# Patient Record
Sex: Female | Born: 1955
Health system: Southern US, Community
[De-identification: ages and names within clinical notes are randomized; demographics above are authoritative.]

## PROBLEM LIST (undated history)

## (undated) DIAGNOSIS — I10 Essential (primary) hypertension: Secondary | ICD-10-CM

## (undated) DIAGNOSIS — E079 Disorder of thyroid, unspecified: Secondary | ICD-10-CM

## (undated) HISTORY — PX: ORTHOPEDIC SURGERY: SHX850

## (undated) HISTORY — PX: OTHER SURGICAL HISTORY: SHX169

## (undated) HISTORY — DX: Essential (primary) hypertension: I10

---

## 2008-02-23 HISTORY — PX: EYE SURGERY: SHX253

## 2010-09-09 ENCOUNTER — Ambulatory Visit (HOSPITAL_COMMUNITY): Admission: RE | Admit: 2010-09-09 | Discharge: 2010-09-09 | Payer: Self-pay | Admitting: Pulmonary Disease

## 2010-09-23 ENCOUNTER — Ambulatory Visit: Payer: Self-pay | Admitting: Orthopedic Surgery

## 2010-09-23 DIAGNOSIS — M1711 Unilateral primary osteoarthritis, right knee: Secondary | ICD-10-CM | POA: Insufficient documentation

## 2010-09-23 DIAGNOSIS — M23302 Other meniscus derangements, unspecified lateral meniscus, unspecified knee: Secondary | ICD-10-CM | POA: Insufficient documentation

## 2010-09-26 ENCOUNTER — Encounter (INDEPENDENT_AMBULATORY_CARE_PROVIDER_SITE_OTHER): Payer: Self-pay | Admitting: *Deleted

## 2010-09-27 ENCOUNTER — Encounter: Payer: Self-pay | Admitting: Orthopedic Surgery

## 2010-09-27 ENCOUNTER — Ambulatory Visit (HOSPITAL_COMMUNITY): Admission: RE | Admit: 2010-09-27 | Discharge: 2010-09-27 | Payer: Self-pay | Admitting: Orthopedic Surgery

## 2010-10-03 ENCOUNTER — Ambulatory Visit: Payer: Self-pay | Admitting: Orthopedic Surgery

## 2010-12-24 NOTE — Letter (Signed)
Summary: history form  history form   Imported By: Jacklynn Ganong 09/30/2010 11:21:45  _____________________________________________________________________  External Attachment:    Type:   Image     Comment:   External Document

## 2010-12-24 NOTE — Assessment & Plan Note (Signed)
Summary: mri results right knee to bring disc.cbt   Visit Type:  Follow-up Referring Provider:  Dr. Juanetta Gosling Primary Provider:  Dr. Juanetta Gosling  CC:  mri results right knee.  History of Present Illness:    Meds: Lisinopril 20mg , HCTZ 12.5, Advil as needed  55 year old female had knee arthroscopy of the RIGHT knee and [place]. Approximately 8 years ago, at which time she was told she had torn meniscus, as well as some mild degeneration of the cartilage presents back with catching and locking and medial knee pain with swelling and loss of motion of the RIGHT knee, which has been going on for approximately a year. She's been treated with anti-inflammatories with minimal improvement  Today is MRI results right knee taken [facility] 09/27/10.  IMPRESSION:   1.  Dominant finding is degenerative change about the knee which appears worst in the medial compartment. 2.  Status post subtotal medial meniscectomy.  A small oblique tear at the junction of the posterior horn and body of the medial meniscus is noted.  Allergies: No Known Drug Allergies  Past History:  Past medical, surgical, family and social histories (including risk factors) reviewed, and no changes noted (except as noted below).  Past Medical History: Reviewed history from 09/23/2010 and no changes required. htn  Past Surgical History: Reviewed history from 09/23/2010 and no changes required. right leg removal of vericose veins  Family History: Reviewed history from 09/23/2010 and no changes required. na  Social History: Reviewed history from 09/23/2010 and no changes required. 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  Gait:    Normal heel-toe gait pattern bilaterally.    Skin:    Intact, no scars, lesions, rashes, cafe au lait spots, or bruising.      Inspection:     No deformity, ecchymosis or swelling.   Palpation:    tenderness R-medial joint line.    Vascular:    There was no swelling or varicose veins. The pulses and temperature are normal. There was no edema or tenderness.  Sensory:    Gross coordination and sensation were normal.    Motor:    Motor strength 5/5 bilaterally for quadriceps, hamstrings, ankle dorsiflexion, and ankle plantar flexion.    Knee Exam:    Right:    Range of Motion:       Flexion-Active: 125 degrees       Extension-Active: full   Impression & Recommendations:  Problem # 1:  ARTHRITIS, RIGHT KNEE (ICD-716.96) Assessment Comment Only  we discussed the findings of this MRI after I reviewed. I reviewed the report. I think that she has arthritis of the knee. There may be a little meniscal tear, but is most likely postop surgical change.  We talked about knee replacement versus nonoperative therapy.  We both decided that a cortisone injection, knee, exercise program weight loss, and exercise, and anti-inflammatory medication would be the better [facility]  Injected RIGHT knee Verbal consent was obtained. The RIGHT knee was prepped with alcohol and ethyl chloride. 1 cc of depomedrol 40mg /cc and 4 cc of lidocaine 1% was injected. there were no complications.  Orders: Est. Patient Level III (16109) Joint Aspirate / Injection, Large (20610) Depo- Medrol 40mg  (J1030)  Problem # 2:  DERANGEMENT MENISCUS (ICD-717.5) Assessment: Comment Only  Orders: Est. Patient Level III (60454)  Medications Added to Medication List  This Visit: 1)  Nabumetone 500 Mg Tabs (Nabumetone) .Marland Kitchen.. 1 by mouth two times a day  Patient Instructions: 1)  You have received an injection of cortisone today. You may experience increased pain at the injection site. Apply ice pack to the area for 20 minutes every 2 hours and take 2 xtra strength tylenol every 8 hours. This increased pain will usually resolve in 24 hours. The  injection will take effect in 3-10 days.  2)  start arthritis medication two times a day 3)  start exercises for the knee for next 6 weeks  4)  return in 3 months  Prescriptions: NABUMETONE 500 MG TABS (NABUMETONE) 1 by mouth two times a day  #60 x 2   Entered and Authorized by:   Fuller Canada MD   Signed by:   Fuller Canada MD on 10/03/2010   Method used:   Print then Give to Patient   RxID:   [id number]    Orders Added: 1)  Est. Patient Level III [13086] 2)  Joint Aspirate / Injection, Large [20610] 3)  Depo- Medrol 40mg  [J1030]

## 2010-12-24 NOTE — Assessment & Plan Note (Signed)
Summary: CONSULT/TREAT/RT KNEE PAIN/NEEDS XRAY/REF [place]/[employer]BS/CAF   Vital Signs:  Patient profile:   55 year old female Height:      65 inches Weight:      190 pounds Pulse rate:   76 / minute Resp:     16 per minute  Vitals Entered By: Fuller Canada MD (September 23, 2010 3:25 PM)  Visit Type:  Initial Consult Referring Provider:  Dr. Juanetta Gosling Primary Provider:  Dr. Juanetta Gosling  CC:  right knee pain.  History of Present Illness: I saw Autumn Pittman in the office today for an initial visit.  She is a 55 years old woman with the complaint of:  right knee pain.  No injury.  Xrays today.  Meds: Lisinopril 20mg , HCTZ 25.86.  55 year old female had knee arthroscopy of the RIGHT knee and [place]. Approximately 8 years ago, at which time she was told she had torn meniscus, as well as some mild degeneration of the cartilage presents back with catching and locking and medial knee pain with swelling and loss of motion of the RIGHT knee, which has been going on for approximately a year. 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 Cardiovascular:  Denies chest pain, palpitations, fainting, and murmurs. Respiratory:  Denies short of breath, wheezing, couch, tightness, pain on inspiration, and snoring . Gastrointestinal:  Denies heartburn, nausea, vomiting, diarrhea, constipation, and blood in your stools. Genitourinary:  Denies frequency, urgency, difficulty urinating, painful urination, flank pain, and bleeding in urine. Neurologic:  Denies numbness, tingling, unsteady gait,  dizziness, tremors, and seizure. Musculoskeletal:  Complains of joint pain and swelling; denies instability, stiffness, redness, heat, and muscle pain. Endocrine:  Denies excessive thirst, exessive urination, and heat or cold intolerance. Psychiatric:  Denies nervousness, depression, anxiety, and hallucinations. Skin:  Denies changes in the skin, poor healing, rash, itching, and redness. HEENT:  Denies blurred or double vision, eye pain, redness, and watering. Immunology:  Denies seasonal allergies, sinus problems, and allergic to bee stings. Hemoatologic:  Denies easy bleeding and brusing.  Physical Exam  Additional Exam:  GEN: well developed, well nourished, normal grooming and hygiene, no deformity and normal body habitus.   CDV: pulses are normal, no edema, no erythema. no tenderness  Lymph: normal lymph nodes   Skin: no rashes, skin lesions or open sores   NEURO: normal coordination, reflexes, sensation.   Psyche: awake, alert and oriented. Mood normal   Gait: is normal  evaluating the RIGHT knee. She does have full range of motion. She has some pain with terminal flexion area, just tenderness over the anterior knee at the patellar tendon, as well as over the medial joint line. The pes tendons seem to be non-tender. McMurray sign is abnormal. all ligaments seem to be stable.   LEFT knee seems to have full range of motion, good strength. No tenderness no swelling in normal alignment. Ligaments are stable    Impression & Recommendations:  Problem # 1:  DERANGEMENT MENISCUS (ICD-717.5) Assessment New  Unable to obtain an x-ray of the RIGHT knee and it shows that she has joint space narrowing which is severe on the medial  side along with some mild to moderate patellofemoral arthritis and mild to moderate varus deformity  However I think it is prudent to go ahead and get the MRI of her knee to ensure [place] that this is not a repair of her meniscus because she is complaining of  locking and catching  Orders: New Patient Level III (60454) Knee x-ray,  3 views (09811)  Problem # 2:  ARTHRITIS, RIGHT KNEE (ICD-716.96) Assessment: New  Orders: New Patient Level III (91478) Knee x-ray,  3 views (29562)  Patient Instructions: 1)  MRI RIGHT knee, return for result   Orders Added: 1)  New Patient Level III [13086] 2)  Knee x-ray,  3 views [73562]

## 2010-12-24 NOTE — Miscellaneous (Signed)
Summary: mri Friday reg 930am [facility] 09/27/10 right knee  Clinical Lists Changes   no precert needed for [employer], fu in our office on 10/03/10 for results bringing disc, mri right knee

## 2011-01-02 ENCOUNTER — Encounter: Payer: Self-pay | Admitting: Orthopedic Surgery

## 2011-01-02 ENCOUNTER — Ambulatory Visit (INDEPENDENT_AMBULATORY_CARE_PROVIDER_SITE_OTHER): Payer: BC Managed Care – PPO | Admitting: Orthopedic Surgery

## 2011-01-02 DIAGNOSIS — M1710 Unilateral primary osteoarthritis, unspecified knee: Secondary | ICD-10-CM

## 2011-01-02 DIAGNOSIS — M23302 Other meniscus derangements, unspecified lateral meniscus, unspecified knee: Secondary | ICD-10-CM

## 2011-01-09 NOTE — Assessment & Plan Note (Signed)
Summary: 3 M RE-CK RT KNEE/BCBS/KJ   Visit Type:  Follow-up Referring Provider:  Dr. Juanetta Gosling Primary Provider:  Dr. Juanetta Gosling  CC:  recheck right knee.  History of Present Illness: I saw Autumn Pittman in the office today for a followup visit.  She is a 55 years old woman with the complaint of:  recheck right knee.  Today is 3 month recheck right knee after injection, HEP and starting Relafen 500mg  two times a day for knee OA.  Meds: Lisinopril 20mg , HCTZ 12.5, Advil as needed.    She says her knee did feel better when she was on Relafen but she got some diarrhea and she had to stop it. After that the shot seemed to wear off, but she got good relief initially.  She is now up to 3 sets of 10 on her exercises, which are strengthened her knee and she is able to walk 3 [place].  Her medial pain seems to be coming back so she came back for reevaluation.     Allergies (verified): No Known Drug Allergies  Past History:  Past Surgical History: Last updated: 09/23/2010 right leg removal of vericose veins  Physical Exam  Additional Exam:  RIGHT knee exam she has seemed to keep her range of motion. She still has quite a bit of medial joint line symptoms and tenderness. Her knee remained stable with excellent strength. His skin is normal. No sensory deficits and the pulse and temperature of the RIGHT leg are normal.  no gait abnormality   Impression & Recommendations:  Problem # 1:  ARTHRITIS, RIGHT KNEE (ICD-716.96) Assessment Deteriorated  Orders: Est. Patient Level III (16109) Joint Aspirate / Injection, Large (20610) Depo- Medrol 40mg  (J1030)  Problem # 2:  DERANGEMENT MENISCUS (ICD-717.5) Assessment: Deteriorated  Verbal consent was obtained. The knee was prepped with alcohol and ethyl chloride. 1 cc of depomedrol 40mg /cc and 4 cc of lidocaine 1% was injected. there were no complications. [right knee]  Orders: Est. Patient Level III (60454) Joint Aspirate / Injection,  Large (20610) Depo- Medrol 40mg  (J1030)  Patient Instructions: 1)  start tylenol 1000 mg three times a day  2)  continue knee exercise program  3)  You have received an injection of cortisone today. You may experience increased pain at the injection site. Apply ice pack to the area for 20 minutes every 2 hours and take 2 xtra strength tylenol every 8 hours. This increased pain will usually resolve in 24 hours. The injection will take effect in 3-10 days.  4)  f/u in 3 mos to repeat xrays of right knee    Orders Added: 1)  Est. Patient Level III [09811] 2)  Joint Aspirate / Injection, Large [20610] 3)  Depo- Medrol 40mg  [J1030]

## 2011-04-02 ENCOUNTER — Ambulatory Visit: Payer: BC Managed Care – PPO | Admitting: Orthopedic Surgery

## 2011-04-22 ENCOUNTER — Ambulatory Visit (INDEPENDENT_AMBULATORY_CARE_PROVIDER_SITE_OTHER): Payer: BC Managed Care – PPO | Admitting: Orthopedic Surgery

## 2011-04-22 DIAGNOSIS — M179 Osteoarthritis of knee, unspecified: Secondary | ICD-10-CM | POA: Insufficient documentation

## 2011-04-22 DIAGNOSIS — IMO0002 Reserved for concepts with insufficient information to code with codable children: Secondary | ICD-10-CM

## 2011-04-22 DIAGNOSIS — M1710 Unilateral primary osteoarthritis, unspecified knee: Secondary | ICD-10-CM

## 2011-04-22 MED ORDER — TRAMADOL-ACETAMINOPHEN 37.5-325 MG PO TABS: 1.0000 | ORAL_TABLET | ORAL | Status: AC | PRN

## 2011-04-22 MED ORDER — TRAMADOL-ACETAMINOPHEN 37.5-325 MG PO TABS: 1.0000 | ORAL_TABLET | ORAL | Status: DC | PRN

## 2011-04-22 NOTE — Progress Notes (Signed)
Separate x-ray report 3 views, RIGHT knee.  The medial joint space is severely narrowed. There is mild varus deformity. This is exacerbated on the lateral x-ray.  Impression medial compartment gonarthrosis

## 2011-04-22 NOTE — Progress Notes (Signed)
55 years old woman with the complaint of: recheck right knee.  Today is 3 month recheck right knee after injection, HEP and starting Relafen 500mg  two times a day for knee OA.  Meds: Lisinopril 20mg , HCTZ 12.5, Advil as needed.  She says her knee did feel better when she was on Relafen but she got some diarrhea and she had to stop it. After that the shot seemed to wear off, but she got good relief initially.     Review of systems musculoskeletal negative.  Exam continued tenderness and pain over the medial compartment. Flexion remains normal stability remains normal. Muscle tone is normal neurovascular damage, normal ambulation, normal.  Repeat x-rays show continued medial compartment joint space narrowing, consistent with medial osteoarthrosis.  Current Medication Advil 400 mg every 4 hours, but only as needed, and not every day, but this is a total of 2400 mg, so she is pretty much maxed out on the Advil.  Recommend Ultracet.

## 2013-01-04 ENCOUNTER — Encounter: Payer: Self-pay | Admitting: Family Medicine

## 2013-01-04 ENCOUNTER — Ambulatory Visit (INDEPENDENT_AMBULATORY_CARE_PROVIDER_SITE_OTHER): Payer: BC Managed Care – PPO | Admitting: Family Medicine

## 2013-01-04 VITALS — BP 146/90 | HR 88 | Ht 66.0 in | Wt 182.0 lb

## 2013-01-04 DIAGNOSIS — M25561 Pain in right knee: Secondary | ICD-10-CM

## 2013-01-04 DIAGNOSIS — M25562 Pain in left knee: Secondary | ICD-10-CM

## 2013-01-04 DIAGNOSIS — M222X1 Patellofemoral disorders, right knee: Secondary | ICD-10-CM

## 2013-01-04 DIAGNOSIS — M1710 Unilateral primary osteoarthritis, unspecified knee: Secondary | ICD-10-CM

## 2013-01-04 DIAGNOSIS — M25569 Pain in unspecified knee: Secondary | ICD-10-CM

## 2013-01-04 DIAGNOSIS — M2240 Chondromalacia patellae, unspecified knee: Secondary | ICD-10-CM

## 2013-01-04 MED ORDER — MELOXICAM 15 MG PO TABS: 15.0000 mg | ORAL_TABLET | Freq: Every day | ORAL | Status: DC

## 2013-01-04 NOTE — Patient Instructions (Signed)
Very good to meet you Do the exercises daily PT will call you  Meloxicam daily for 10 days, at your pharmacy.  Come back in 4 weeks.

## 2013-01-04 NOTE — Assessment & Plan Note (Signed)
Patient does have bilateral chondromalacia. Patient is having more symptoms left greater than right. This is skeletal ultrasound did not show any signs of meniscal injury but did have degenerative tear of the lateral meniscus.  After verbal and written consent patient was prepped with alcohol swabs and then had injection of 4 cc of 0.5% Marcaine and 1 cc of 80 mg Depo-Medrol into the knees bilaterally with a 25-gauge 1/2 inch needle. Patient tolerated the procedure well and had pain improvement immediately.  Patient was given exercises to do a regular basis, we'll go to formal physical therapy for further range of motion and strengthening exercises. Patient given a ten-day course meloxicam. Patient will followup again in 3-4 weeks for further evaluation to make sure she continues to improve.

## 2013-01-05 NOTE — Assessment & Plan Note (Signed)
Injected as stated under chondromalacia  Same follow up  HEP

## 2013-01-05 NOTE — Progress Notes (Signed)
Chief complaint: Left knee pain  Patient is a very pleasant 57 year old female who is coming in with worsening left knee pain. Patient has a past medical history significant for a right knee medial meniscectomy back in 2004 and arthritis of the right knee. Patient describes the pain as a sharp stabbing sensation on the anterior aspect of the knee because of the medial to lateral position. Patient states that it hurts more when she climbs stairs or go downstairs. Patient denies any clicking popping but does state she has a grinding sensation. She denies that the pain radiates. Patient has tried over-the-counter anti-inflammatories with minimal improvement. Patient does work on her feet a lot and states that this is causing some concern with her work.  Patient states also because of this pain she's been using her right knee more and started having significantly more pain. Patient though she does have arthritis and there and describes the pain as more of a dull ache.  States much different from left knee and only hurting because she is compensating for her left knee pain.   No swelling or numbness.   Past Medical History  Diagnosis Date  . HTN (hypertension)    Past Surgical History  Procedure Laterality Date  . Varicose vein removal      rt leg    History  Substance Use Topics  . Smoking status: Never Smoker   . Smokeless tobacco: Never Used  . Alcohol Use: Yes   No family history on file.  Current Outpatient Prescriptions on File Prior to Visit  Medication Sig Dispense Refill  . hydrochlorothiazide (HYDRODIURIL) 12.5 MG tablet Take 12.5 mg by mouth daily.        . Ibuprofen (ADVIL PO) Take by mouth.        Marland Kitchen LISINOPRIL PO Take 20 mg by mouth daily.        No current facility-administered medications on file prior to visit.   Physical exam Blood pressure 146/90, pulse 88, height 5\' 6"  (1.676 m), weight 182 lb (82.555 kg). General: No apparent distress alert and oriented x3 mood and  affect normal Respiratory: Patient's speak in full sentences and does not appear short of breath Skin: Warm dry intact with no signs of infection or rash Neuro: Cranial nerves II through XII are intact, neurovascularly intact in all extremities with 2+ DTRs and 2+ pulses. Right knee exam: Patient has full range of motion minimally tender over the medial joint line. Neurovascularly intact distally. Negative Murray's, or ligaments appear to be intact. Patient has mild crepitus with range of motion. Left knee exam: Patient does have trace swelling on the anterior aspect of the knee. She is tender to palpation over the medial joint line and superior to the patella. Patient has significant crepitus under the patella and does have lateral tracking she does have a positive compression test on the patella. All ligaments appear to be intact and has a negative McMurray's. Neurovascular intact distally.

## 2013-01-09 ENCOUNTER — Other Ambulatory Visit: Payer: Self-pay

## 2013-01-13 ENCOUNTER — Encounter: Payer: Self-pay | Admitting: Family Medicine

## 2013-02-08 ENCOUNTER — Ambulatory Visit (INDEPENDENT_AMBULATORY_CARE_PROVIDER_SITE_OTHER): Payer: BC Managed Care – PPO | Admitting: Family Medicine

## 2013-02-08 ENCOUNTER — Encounter: Payer: Self-pay | Admitting: Family Medicine

## 2013-02-08 VITALS — BP 111/76 | HR 86 | Ht 62.0 in | Wt 182.0 lb

## 2013-02-08 DIAGNOSIS — M2240 Chondromalacia patellae, unspecified knee: Secondary | ICD-10-CM

## 2013-02-08 DIAGNOSIS — M7050 Other bursitis of knee, unspecified knee: Secondary | ICD-10-CM | POA: Insufficient documentation

## 2013-02-08 DIAGNOSIS — IMO0002 Reserved for concepts with insufficient information to code with codable children: Secondary | ICD-10-CM

## 2013-02-08 NOTE — Assessment & Plan Note (Signed)
The patient at this time does have what appears to be a pes anserine bursitis. Patient given exercises focusing on hamstring stretching but declined any other treatment options such as topical anti-inflammatories. Patient can take meloxicam daily for 3 days when it seems to be worsening. Encouraged to do icing on a regular basis as well. We can follow up again in 6 weeks if not better.

## 2013-02-08 NOTE — Progress Notes (Signed)
Chief complaint bilateral knee pain  History present illness: The patient was seen previously for what appeared to be a chondromalacia of her knees bilaterally. Patient did have steroid injections in both knees. Patient tolerated the procedure very well. Patient states since that time it has improved significantly. Patient continues to do the home exercise program on a regular basis. Patient has not formal physical therapy at this time. Overall patient states that she is approximately 80% better.  Past medical history, social, surgical and family history all reviewed.   Physical exam Blood pressure 111/76, pulse 86, height 5\' 2"  (1.575 m), weight 182 lb (82.555 kg). General: No apparent distress alert and oriented x3 mood is normal. Right knee exam full range of motion nontender and is neurovascularly intact distally. Negative McMurray's and all ligaments intact. Positive patella grind test Left knee exam: Patient does have tenderness to palpation over the pes anserine. Otherwise unremarkable exam with full range of motion and nontender over the mediolateral joint lines. Neurovascularly intact distally.

## 2013-02-08 NOTE — Assessment & Plan Note (Signed)
Patient appears to be doing better overall.  Encouraged patient continue home exercises. Patient may start formal physical therapy if she would like to but at this point she continues to improve I think we can continue to monitor. The patient will continue to wear the compression sleeve as well. She will follow up in 6-8 weeks if doing better.

## 2013-03-29 ENCOUNTER — Ambulatory Visit (HOSPITAL_BASED_OUTPATIENT_CLINIC_OR_DEPARTMENT_OTHER)
Admission: RE | Admit: 2013-03-29 | Discharge: 2013-03-29 | Disposition: A | Discharge status: Home / Self Care | Payer: BC Managed Care – PPO | Source: Ambulatory Visit | Attending: Family Medicine | Admitting: Family Medicine

## 2013-03-29 ENCOUNTER — Ambulatory Visit (INDEPENDENT_AMBULATORY_CARE_PROVIDER_SITE_OTHER): Payer: BC Managed Care – PPO | Admitting: Family Medicine

## 2013-03-29 ENCOUNTER — Encounter: Payer: Self-pay | Admitting: Family Medicine

## 2013-03-29 VITALS — BP 155/101 | HR 88 | Ht 66.0 in | Wt 190.0 lb

## 2013-03-29 DIAGNOSIS — M25569 Pain in unspecified knee: Secondary | ICD-10-CM

## 2013-03-29 DIAGNOSIS — M25562 Pain in left knee: Secondary | ICD-10-CM

## 2013-03-29 NOTE — Patient Instructions (Addendum)
Your primary issues are quad tendinitis and patellofemoral syndrome. Start formal physical therapy and do home exercises daily. Take aleve 2 tabs twice a day with food for pain and inflammation. Ice knee 15 minutes at a time 3-4 times a day. ACE wrap or compression sleeve to help keep swelling down. Consider nitro patches if not improving from quad tendinitis over next 5-6 weeks. A repeat cortisone shot with or without aspiration is a consideration - you have underlying mild knee arthritis but may also have a degenerative meniscal tear (both are treated similarly initially though). Follow up with me or in the [place] office in about 6 weeks for reevaluation.

## 2013-03-31 ENCOUNTER — Encounter: Payer: Self-pay | Admitting: Family Medicine

## 2013-03-31 DIAGNOSIS — M25561 Pain in right knee: Secondary | ICD-10-CM | POA: Insufficient documentation

## 2013-03-31 NOTE — Assessment & Plan Note (Signed)
ultrasound confirmed that she has an effusion though her primary issues at this point is quad tendinitis with patellofemoral syndrome.  She would like to start formal physical therapy - written for this.  Aleve twice a day, icing, elevation, compression.  Consider nitro patches if not improving.  Discussed repeat intraarticular injection (with or without aspiration) - only condition this would help is underlying arthritis and possible a degenerative meniscus tear - advised to defer at this time.  F/u in 6 weeks for reevaluation.

## 2013-03-31 NOTE — Progress Notes (Signed)
  Subjective:    Patient ID: Autumn Pittman, female    DOB: 08-25-1956, 57 y.o.   MRN: [id number]  PCP: Dr. Shaune Pollack  HPI 57 yo F here for left knee pain.  Patient has been seen in [place] office - had been doing well following an intraarticular left knee cortisone injection and home exercise program for patellofemoral syndrome. States this really flared up recently when she did a lot of steps (over 20000) when at [facility]. Started with some medial swelling, pain and progressed to behind kneecap and above kneecap. Hurts if she extends her knee. Has been icing and doing her home exercises. No catching, locking.  Past Medical History  Diagnosis Date  . HTN (hypertension)     Current Outpatient Prescriptions on File Prior to Visit  Medication Sig Dispense Refill  . hydrochlorothiazide (HYDRODIURIL) 12.5 MG tablet Take 12.5 mg by mouth daily.        . Ibuprofen (ADVIL PO) Take by mouth.        Marland Kitchen LISINOPRIL PO Take 20 mg by mouth daily.       . meloxicam (MOBIC) 15 MG tablet Take 1 tablet (15 mg total) by mouth daily.  10 tablet  2   No current facility-administered medications on file prior to visit.    Past Surgical History  Procedure Laterality Date  . Varicose vein removal      rt leg     Allergies  Allergen Reactions  . Relafen (Nabumetone) Diarrhea    History   Social History  . Marital Status: Married    Spouse Name: N/A    Number of Children: N/A  . Years of Education: college    Occupational History  . Administrative work     Social History Main Topics  . Smoking status: Never Smoker   . Smokeless tobacco: Never Used  . Alcohol Use: Yes  . Drug Use: Not on file  . Sexually Active: Not on file   Other Topics Concern  . Not on file   Social History Narrative  . No narrative on file    History reviewed. No pertinent family history.  BP 155/101  Pulse 88  Ht 5\' 6"  (1.676 m)  Wt 190 lb (86.183 kg)  BMI 30.68 kg/m2  Review of  Systems See HPI above.    Objective:   Physical Exam Gen: NAD  L knee: Mild effusion.  No ecchymoses, other deformity. TTP quad tendon reproducing pain - less post patellar facets, medial joint line. FROM but slow movements. Negative ant/post drawers. Negative valgus/varus testing. Negative lachmanns. Negative mcmurrays, apleys, patellar apprehension, clarkes. NV intact distally.    Assessment & Plan:  1. Left knee pain - ultrasound confirmed that she has an effusion though her primary issues at this point is quad tendinitis with patellofemoral syndrome.  She would like to start formal physical therapy - written for this.  Aleve twice a day, icing, elevation, compression.  Consider nitro patches if not improving.  Discussed repeat intraarticular injection (with or without aspiration) - only condition this would help is underlying arthritis and possible a degenerative meniscus tear - advised to defer at this time.  F/u in 6 weeks for reevaluation.

## 2013-06-22 ENCOUNTER — Other Ambulatory Visit (HOSPITAL_COMMUNITY): Payer: Self-pay | Admitting: Pulmonary Disease

## 2013-06-22 DIAGNOSIS — R7989 Other specified abnormal findings of blood chemistry: Secondary | ICD-10-CM

## 2013-07-11 ENCOUNTER — Ambulatory Visit (HOSPITAL_COMMUNITY)
Admission: RE | Admit: 2013-07-11 | Discharge: 2013-07-11 | Disposition: A | Discharge status: Home / Self Care | Payer: BC Managed Care – PPO | Source: Ambulatory Visit | Attending: Pulmonary Disease | Admitting: Pulmonary Disease

## 2013-07-11 DIAGNOSIS — R7989 Other specified abnormal findings of blood chemistry: Secondary | ICD-10-CM | POA: Insufficient documentation

## 2013-07-11 DIAGNOSIS — K824 Cholesterolosis of gallbladder: Secondary | ICD-10-CM | POA: Insufficient documentation

## 2013-07-11 DIAGNOSIS — K7689 Other specified diseases of liver: Secondary | ICD-10-CM | POA: Insufficient documentation

## 2013-07-21 ENCOUNTER — Ambulatory Visit (INDEPENDENT_AMBULATORY_CARE_PROVIDER_SITE_OTHER): Payer: BC Managed Care – PPO | Admitting: Family Medicine

## 2013-07-21 ENCOUNTER — Encounter: Payer: Self-pay | Admitting: Family Medicine

## 2013-07-21 VITALS — BP 148/91 | HR 81 | Ht 65.0 in | Wt 179.0 lb

## 2013-07-21 DIAGNOSIS — M25561 Pain in right knee: Secondary | ICD-10-CM

## 2013-07-21 DIAGNOSIS — M25569 Pain in unspecified knee: Secondary | ICD-10-CM

## 2013-07-21 MED ORDER — METHYLPREDNISOLONE ACETATE 40 MG/ML IJ SUSP
40.0000 mg | Freq: Once | INTRAMUSCULAR | Status: AC
  Administered 2013-07-21: 40 mg via INTRA_ARTICULAR

## 2013-07-21 NOTE — Patient Instructions (Addendum)
For arthritis: Take tylenol 500mg  1-2 tabs three times a day for pain. Aleve 1-2 tabs twice a day with food OR ibuprofen 600mg  three times a day with food. Glucosamine sulfate 750mg  twice a day is a supplement that may help. Capsaicin topically up to four times a day may also help with pain. Cortisone injections are an option. If cortisone injections do not help, there are different types of shots that may help but they take longer to take effect. It's important that you continue to stay active. Continue home exercise program. Consider physical therapy to strengthen muscles around the joint that hurts to take pressure off of the joint itself. Shoe inserts with good arch support may be helpful. Heat or ice 15 minutes at a time 3-4 times a day as needed to help with pain. Water aerobics and cycling with low resistance are the best two types of exercise for arthritis. Follow up with me in 1 month for reevaluation.

## 2013-07-26 ENCOUNTER — Encounter: Payer: Self-pay | Admitting: Family Medicine

## 2013-07-26 NOTE — Assessment & Plan Note (Signed)
consistent this time with flare of known mild DJD of knees.  Discussed tylenol, nsaids, glucosamine, capsaicin.  Cortisone injections given today.  Will continue with her HEP as well.  Ice as needed.  Consider PT.  F/u in 1 month.  After informed written consent, patient was lying supine on exam table. Right knee was prepped with alcohol swab and utilizing superolateral approach with ultrasound guidance, patient's right knee was injected intraarticularly with 3:1 marcaine: depomedrol. Patient tolerated the procedure well without immediate complications.  After informed written consent, patient was lying supine on exam table. Left knee was prepped with alcohol swab and utilizing superolateral approach with ultrasound guidance, patient's left knee was injected intraarticularly with 3:1 marcaine: depomedrol. Patient tolerated the procedure well without immediate complications.

## 2013-07-26 NOTE — Progress Notes (Signed)
Patient ID: Autumn Pittman, female   DOB: 1955/12/29, 57 y.o.   MRN: [id number]  PCP: Dr. Shaune Pollack  HPI 57 yo F here for f/u bilateral knee pain.  5/6: Patient has been seen in [place] office - had been doing well following an intraarticular left knee cortisone injection and home exercise program for patellofemoral syndrome. States this really flared up recently when she did a lot of steps (over 20000) when at [facility]. Started with some medial swelling, pain and progressed to behind kneecap and above kneecap. Hurts if she extends her knee. Has been icing and doing her home exercises. No catching, locking.  8/28: Patient has continued to do HEP, taking ibuprofen. Pain is in a different location than before - not patellar tendon area.  Is mostly medial and lateral and behind kneecaps. Some crunching noises. No giving out, locking, catching.  Past Medical History  Diagnosis Date  . HTN (hypertension)     Current Outpatient Prescriptions on File Prior to Visit  Medication Sig Dispense Refill  . hydrochlorothiazide (HYDRODIURIL) 12.5 MG tablet Take 12.5 mg by mouth daily.        . Ibuprofen (ADVIL PO) Take by mouth.         No current facility-administered medications on file prior to visit.    Past Surgical History  Procedure Laterality Date  . Varicose vein removal      rt leg     Allergies  Allergen Reactions  . Relafen [Nabumetone] Diarrhea    History   Social History  . Marital Status: Married    Spouse Name: N/A    Number of Children: N/A  . Years of Education: college    Occupational History  . Administrative work     Social History Main Topics  . Smoking status: Never Smoker   . Smokeless tobacco: Never Used  . Alcohol Use: Yes  . Drug Use: Not on file  . Sexual Activity: Not on file   Other Topics Concern  . Not on file   Social History Narrative  . No narrative on file    History reviewed. No pertinent family history.  BP 148/91   Pulse 81  Ht 5\' 5"  (1.651 m)  Wt 179 lb (81.194 kg)  BMI 29.79 kg/m2  Review of Systems See HPI above.    Objective:   Physical Exam Gen: NAD  Bilateral knees: Mild effusion.  No ecchymoses, other deformity. Medial and post patellar facet TTP, minimal lateral joint line TTP.  No quad tendon TTP. FROM. Negative ant/post drawers. Negative valgus/varus testing. Negative lachmanns. Negative mcmurrays, apleys, patellar apprehension. NV intact distally.    Assessment & Plan:  1. Bilateral knee pain - consistent this time with flare of known mild DJD of knees.  Discussed tylenol, nsaids, glucosamine, capsaicin.  Cortisone injections given today.  Will continue with her HEP as well.  Ice as needed.  Consider PT.  F/u in 1 month.  After informed written consent, patient was lying supine on exam table. Right knee was prepped with alcohol swab and utilizing superolateral approach with ultrasound guidance, patient's right knee was injected intraarticularly with 3:1 marcaine: depomedrol. Patient tolerated the procedure well without immediate complications.  After informed written consent, patient was lying supine on exam table. Left knee was prepped with alcohol swab and utilizing superolateral approach with ultrasound guidance, patient's left knee was injected intraarticularly with 3:1 marcaine: depomedrol. Patient tolerated the procedure well without immediate complications.

## 2013-09-29 ENCOUNTER — Other Ambulatory Visit: Payer: Self-pay

## 2014-02-10 ENCOUNTER — Encounter: Payer: Self-pay | Admitting: Family Medicine

## 2014-02-10 ENCOUNTER — Ambulatory Visit (INDEPENDENT_AMBULATORY_CARE_PROVIDER_SITE_OTHER): Payer: BC Managed Care – PPO | Admitting: Family Medicine

## 2014-02-10 VITALS — BP 173/107 | HR 67 | Ht 66.0 in | Wt 190.0 lb

## 2014-02-10 DIAGNOSIS — M25562 Pain in left knee: Principal | ICD-10-CM

## 2014-02-10 DIAGNOSIS — M25569 Pain in unspecified knee: Secondary | ICD-10-CM

## 2014-02-10 DIAGNOSIS — M25561 Pain in right knee: Secondary | ICD-10-CM

## 2014-02-10 NOTE — Patient Instructions (Signed)
For arthritis: Take tylenol 500mg  1-2 tabs three times a day for pain. Aleve 1-2 tabs twice a day with food OR ibuprofen 600mg  three times a day with food. Glucosamine sulfate 750mg  twice a day is a supplement that may help. Capsaicin topically up to four times a day may also help with pain. Cortisone injections are an option. If cortisone injections do not help, there are different types of shots that may help but they take longer to take effect. It's important that you continue to stay active. Continue home exercise program. Shoe inserts with good arch support may be helpful. Heat or ice 15 minutes at a time 3-4 times a day as needed to help with pain. Water aerobics and cycling with low resistance are the best two types of exercise for arthritis. Follow up with me as needed.

## 2014-02-15 ENCOUNTER — Encounter: Payer: Self-pay | Admitting: Family Medicine

## 2014-02-15 NOTE — Progress Notes (Signed)
Patient ID: Autumn Pittman, female   DOB: 18-Feb-1956, 58 y.o.   MRN: [id number]  PCP: Dr. Shaune Pollack  HPI 58 yo F here for f/u bilateral knee pain.  5/6: Patient has been seen in [place] office - had been doing well following an intraarticular left knee cortisone injection and home exercise program for patellofemoral syndrome. States this really flared up recently when she did a lot of steps (over 20000) when at [facility]. Started with some medial swelling, pain and progressed to behind kneecap and above kneecap. Hurts if she extends her knee. Has been icing and doing her home exercises. No catching, locking.  8/28: Patient has continued to do HEP, taking ibuprofen. Pain is in a different location than before - not patellar tendon area.  Is mostly medial and lateral and behind kneecaps. Some crunching noises. No giving out, locking, catching.  02/10/14: Patient reports injections and physical therapy helped with both knees though has started to come back past 2-3 months. More grinding in both knees and swelling. Icing and taking ibuprofen, tylenol. No giving out, locking, catching.  Past Medical History  Diagnosis Date  . HTN (hypertension)     Current Outpatient Prescriptions on File Prior to Visit  Medication Sig Dispense Refill  . hydrochlorothiazide (HYDRODIURIL) 12.5 MG tablet Take 12.5 mg by mouth daily.        . Ibuprofen (ADVIL PO) Take by mouth.         No current facility-administered medications on file prior to visit.    Past Surgical History  Procedure Laterality Date  . Varicose vein removal      rt leg     Allergies  Allergen Reactions  . Relafen [Nabumetone] Diarrhea    History   Social History  . Marital Status: Married    Spouse Name: N/A    Number of Children: N/A  . Years of Education: college    Occupational History  . Administrative work     Social History Main Topics  . Smoking status: Never Smoker   . Smokeless tobacco:  Never Used  . Alcohol Use: Yes  . Drug Use: Not on file  . Sexual Activity: Not on file   Other Topics Concern  . Not on file   Social History Narrative  . No narrative on file    History reviewed. No pertinent family history.  BP 173/107  Pulse 67  Ht 5\' 6"  (1.676 m)  Wt 190 lb (86.183 kg)  BMI 30.68 kg/m2  Review of Systems See HPI above.    Objective:   Physical Exam Gen: NAD  Bilateral knees: Mod effusion left, minimal on right.  No ecchymoses, other deformity. Medial and post patellar facet TTP, minimal lateral joint line TTP.  No quad tendon TTP. FROM. Negative ant/post drawers. Negative valgus/varus testing. Negative lachmanns. Negative mcmurrays, apleys, patellar apprehension. NV intact distally.    Assessment & Plan:  1. Bilateral knee pain - consistent with flare of known mild DJD of knees.  Discussed tylenol, nsaids, glucosamine, capsaicin.  Cortisone injections given today.  Will continue with her HEP as well.  Ice as needed.  Completed PT. F/u prn.   After informed written consent, patient was seated on exam table. Right knee was prepped with alcohol swab and utilizing anteromedial approach, patient's right knee was injected intraarticularly with 3:1 marcaine: depomedrol. Patient tolerated the procedure well without immediate complications.  After informed written consent patient was lying supine on exam table.  Left knee  was prepped with alcohol swab.  Utilizing superolateral approach, 3 mL of marcaine was used for local anesthesia.  Then using an 18g needle on 60cc syringe, 35 mL of clear straw-colored fluid was aspirated from left knee.  Knee was then injected with 3:1 marcaine:depomedrol.  Patient tolerated procedure well without immediate complications

## 2014-02-15 NOTE — Assessment & Plan Note (Signed)
consistent with flare of known mild DJD of knees.  Discussed tylenol, nsaids, glucosamine, capsaicin.  Cortisone injections given today.  Will continue with her HEP as well.  Ice as needed.  Completed PT. F/u prn.   After informed written consent, patient was seated on exam table. Right knee was prepped with alcohol swab and utilizing anteromedial approach, patient's right knee was injected intraarticularly with 3:1 marcaine: depomedrol. Patient tolerated the procedure well without immediate complications.  After informed written consent patient was lying supine on exam table.  Left knee was prepped with alcohol swab.  Utilizing superolateral approach, 3 mL of marcaine was used for local anesthesia.  Then using an 18g needle on 60cc syringe, 35 mL of clear straw-colored fluid was aspirated from left knee.  Knee was then injected with 3:1 marcaine:depomedrol.  Patient tolerated procedure well without immediate complications

## 2014-04-26 ENCOUNTER — Encounter: Payer: Self-pay | Admitting: Family Medicine

## 2014-04-26 ENCOUNTER — Ambulatory Visit (INDEPENDENT_AMBULATORY_CARE_PROVIDER_SITE_OTHER): Payer: BC Managed Care – PPO | Admitting: Family Medicine

## 2014-04-26 VITALS — BP 176/118 | HR 80 | Ht 65.0 in | Wt 192.0 lb

## 2014-04-26 DIAGNOSIS — M25569 Pain in unspecified knee: Secondary | ICD-10-CM

## 2014-04-26 DIAGNOSIS — M25561 Pain in right knee: Secondary | ICD-10-CM

## 2014-04-26 DIAGNOSIS — M25562 Pain in left knee: Principal | ICD-10-CM

## 2014-04-26 MED ORDER — METHYLPREDNISOLONE ACETATE 40 MG/ML IJ SUSP
40.0000 mg | Freq: Once | INTRAMUSCULAR | Status: AC
  Administered 2014-04-26: 40 mg via INTRA_ARTICULAR

## 2014-04-26 NOTE — Patient Instructions (Signed)
For arthritis: Take tylenol 500mg  1-2 tabs three times a day for pain. Glucosamine sulfate 750mg  twice a day is a supplement that may help. Capsaicin topically up to four times a day may also help with pain. Cortisone injections are an option - we repeated these today. If cortisone injections do not help, there are different types of shots that may help but they take longer to take effect - typically done weekly for 3 shots then follow up 4 weeks after the last shot. It's important that you continue to stay active. Continue home exercise program. Shoe inserts with good arch support may be helpful. Heat or ice 15 minutes at a time 3-4 times a day as needed to help with pain. Water aerobics and cycling with low resistance are the best two types of exercise for arthritis. Follow up with me as needed.

## 2014-04-27 ENCOUNTER — Encounter: Payer: Self-pay | Admitting: Family Medicine

## 2014-04-27 NOTE — Progress Notes (Signed)
Patient ID: Autumn Pittman, female   DOB: [birth year], 58 y.o.   MRN: [id number]  PCP: Dr. Shaune Pollack  Knee Pain    58 yo F here for f/u bilateral knee pain.  5/6: Patient has been seen in [place] office - had been doing well following an intraarticular left knee cortisone injection and home exercise program for patellofemoral syndrome. States this really flared up recently when she did a lot of steps (over 20000) when at [facility]. Started with some medial swelling, pain and progressed to behind kneecap and above kneecap. Hurts if she extends her knee. Has been icing and doing her home exercises. No catching, locking.  8/28: Patient has continued to do HEP, taking ibuprofen. Pain is in a different location than before - not patellar tendon area.  Is mostly medial and lateral and behind kneecaps. Some crunching noises. No giving out, locking, catching.  02/10/14: Patient reports injections and physical therapy helped with both knees though has started to come back past 2-3 months. More grinding in both knees and swelling. Icing and taking ibuprofen, tylenol. No giving out, locking, catching.  6/2: Patient returns for repeat injections into knees. Difficulty with stairs, walking. Both swelling. Still grinding. No catching, locking, giving out.  Past Medical History  Diagnosis Date  . HTN (hypertension)     Current Outpatient Prescriptions on File Prior to Visit  Medication Sig Dispense Refill  . hydrochlorothiazide (HYDRODIURIL) 12.5 MG tablet Take 12.5 mg by mouth daily.        . Ibuprofen (ADVIL PO) Take by mouth.         No current facility-administered medications on file prior to visit.    Past Surgical History  Procedure Laterality Date  . Varicose vein removal      rt leg     Allergies  Allergen Reactions  . Relafen [Nabumetone] Diarrhea    History   Social History  . Marital Status: Married    Spouse Name: N/A    Number of Children: N/A  .  Years of Education: college    Occupational History  . Administrative work     Social History Main Topics  . Smoking status: Never Smoker   . Smokeless tobacco: Never Used  . Alcohol Use: Yes  . Drug Use: Not on file  . Sexual Activity: Not on file   Other Topics Concern  . Not on file   Social History Narrative  . No narrative on file    History reviewed. No pertinent family history.  BP 176/118  Pulse 80  Ht 5\' 5"  (1.651 m)  Wt 192 lb (87.091 kg)  BMI 31.95 kg/m2  Review of Systems See HPI above.    Objective:   Physical Exam Gen: NAD  Bilateral knees: Mod effusion left, minimal on right.  No ecchymoses, other deformity. Medial and post patellar facet TTP, minimal lateral joint line TTP.  No quad tendon TTP. FROM. Negative ant/post drawers. Negative valgus/varus testing. Negative lachmanns. Negative mcmurrays, apleys, patellar apprehension. NV intact distally.    Assessment & Plan:  1. Bilateral knee pain - consistent with flare of known mild DJD of knees.  Discussed tylenol, nsaids, glucosamine, capsaicin.  Cortisone injections given today.  Will continue with her HEP as well.  Ice as needed.  Completed PT. F/u prn.   After informed written consent, patient was seated on exam table. Right knee was prepped with alcohol swab and utilizing anteromedial approach, patient's right knee was injected intraarticularly with  3:1 marcaine: depomedrol. Patient tolerated the procedure well without immediate complications.  After informed written consent patient was lying supine on exam table.  Left knee was prepped with alcohol swab.  Utilizing superolateral approach, left knee was injected with 3:1 marcaine:depomedrol.  Patient tolerated procedure well without immediate complications

## 2014-04-27 NOTE — Assessment & Plan Note (Signed)
consistent with flare of known mild DJD of knees.  Discussed tylenol, nsaids, glucosamine, capsaicin.  Cortisone injections given today.  Will continue with her HEP as well.  Ice as needed.  Completed PT. F/u prn.   After informed written consent, patient was seated on exam table. Right knee was prepped with alcohol swab and utilizing anteromedial approach, patient's right knee was injected intraarticularly with 3:1 marcaine: depomedrol. Patient tolerated the procedure well without immediate complications.  After informed written consent patient was lying supine on exam table.  Left knee was prepped with alcohol swab.  Utilizing superolateral approach, left knee was injected with 3:1 marcaine:depomedrol.  Patient tolerated procedure well without immediate complications

## 2014-11-29 ENCOUNTER — Encounter: Payer: Self-pay | Admitting: Family Medicine

## 2014-11-29 ENCOUNTER — Ambulatory Visit (HOSPITAL_BASED_OUTPATIENT_CLINIC_OR_DEPARTMENT_OTHER)
Admission: RE | Admit: 2014-11-29 | Discharge: 2014-11-29 | Disposition: A | Discharge status: Home / Self Care | Payer: BLUE CROSS/BLUE SHIELD | Source: Ambulatory Visit | Attending: Family Medicine | Admitting: Family Medicine

## 2014-11-29 ENCOUNTER — Ambulatory Visit (INDEPENDENT_AMBULATORY_CARE_PROVIDER_SITE_OTHER): Payer: BLUE CROSS/BLUE SHIELD | Admitting: Family Medicine

## 2014-11-29 VITALS — Ht 65.0 in | Wt 185.0 lb

## 2014-11-29 DIAGNOSIS — M179 Osteoarthritis of knee, unspecified: Secondary | ICD-10-CM | POA: Insufficient documentation

## 2014-11-29 DIAGNOSIS — M25562 Pain in left knee: Secondary | ICD-10-CM

## 2014-11-29 DIAGNOSIS — M25561 Pain in right knee: Secondary | ICD-10-CM | POA: Insufficient documentation

## 2014-11-29 NOTE — Patient Instructions (Signed)
Get x-rays as you leave today. We will refer [facility] to [facility] to discuss knee replacement as well.

## 2014-12-04 NOTE — Progress Notes (Signed)
Patient ID: Autumn Pittman, female   DOB: [birth year], 59 y.o.   MRN: [id number]  PCP: Dr. Shaune PollackEd Hawkins  Knee Pain    59 yo F here for f/u bilateral knee pain.  5/6: [place] been [place] in [place] office - had been doing well following an intraarticular left knee cortisone injection and home exercise program for patellofemoral syndrome. States this really flared up recently when she did a lot of steps (over 20000) when [facility]. Started with some medial swelling, pain and progressed to behind kneecap and above kneecap. Hurts if she extends her knee. Has been icing and doing her home exercises. No catching, locking.  8/28: Patient has continued to do HEP, taking ibuprofen. Pain is in a different location than before - not patellar tendon area.  Is mostly medial and lateral and behind kneecaps. Some crunching noises. No giving out, locking, catching.  02/10/14: Patient reports injections and physical therapy helped with both knees though has started to come back past 2-3 months. More grinding in both knees and swelling. Icing and taking ibuprofen, tylenol. No giving out, locking, catching.  6/2: Patient returns for repeat injections into knees. Difficulty with stairs, walking. Both swelling. Still grinding. No catching, locking, giving out.  11/29/14: Patient reports having R> L knee pain. Has been bothering her for months though has been trying to push through it. Right knee 8/10 pain, left 5/10 level. Swelling in right more than left knee. Can radiate down to ankle. Difficulty walking due to pain. No catching, locking, giving out. Interested in referral to discuss replacement of right knee.  Past Medical History  Diagnosis Date  . HTN (hypertension)     Current Outpatient Prescriptions on File Prior to Visit  Medication Sig Dispense Refill  . hydrochlorothiazide (HYDRODIURIL) 12.5 MG tablet Take 12.5 mg by mouth daily.      . Ibuprofen (ADVIL PO) Take by  mouth.       No current facility-administered medications on file prior to visit.    Past Surgical History  Procedure Laterality Date  . Varicose vein removal      rt leg     Allergies  Allergen Reactions  . Relafen [Nabumetone] Diarrhea    History   Social History  . Marital Status: Married    Spouse Name: N/A    Number of Children: N/A  . Years of Education: college    Occupational History  . Administrative work     Social History Main Topics  . Smoking status: Never Smoker   . Smokeless tobacco: Never Used  . Alcohol Use: 0.0 oz/week    0 Not specified per week  . Drug Use: Not on file  . Sexual Activity: Not on file   Other Topics Concern  . Not on file   Social History Narrative    No family history on file.  Ht 5\' 5"  (1.651 m)  Wt 185 lb (83.915 kg)  BMI 30.79 kg/m2  Review of Systems See HPI above.    Objective:   Physical Exam Gen: NAD  Bilateral knees: Mod effusion right, mild on left.  No ecchymoses, other deformity. Medial and post patellar facet TTP, minimal lateral joint line TTP.  No quad tendon TTP. FROM. Negative ant/post drawers. Negative valgus/varus testing. Negative lachmanns. Negative mcmurrays, apleys, patellar apprehension. NV intact distally.    Assessment & Plan:  1. Bilateral knee pain - 2/2 DJD.  Repeated her right knee radiographs which showed severe bone-on-bone medial DJD  with DJD in other compartments as well.  She is interested in knee replacement at this time- will place referral.  Discussed repeating injection, viscosupplementation as well.

## 2014-12-04 NOTE — Assessment & Plan Note (Signed)
2/2 DJD.  Repeated her right knee radiographs which showed severe bone-on-bone medial DJD with DJD in other compartments as well.  She is interested in knee replacement at this time- will place referral.  Discussed repeating injection, viscosupplementation as well.

## 2015-03-08 ENCOUNTER — Other Ambulatory Visit: Payer: Self-pay | Admitting: Orthopedic Surgery

## 2015-03-30 ENCOUNTER — Other Ambulatory Visit (HOSPITAL_COMMUNITY): Payer: BLUE CROSS/BLUE SHIELD

## 2015-04-02 ENCOUNTER — Encounter (HOSPITAL_COMMUNITY)
Admission: RE | Admit: 2015-04-02 | Discharge: 2015-04-02 | Disposition: A | Discharge status: Home / Self Care | Payer: BLUE CROSS/BLUE SHIELD | Source: Ambulatory Visit | Attending: Orthopedic Surgery | Admitting: Orthopedic Surgery

## 2015-04-02 ENCOUNTER — Ambulatory Visit (HOSPITAL_COMMUNITY)
Admission: RE | Admit: 2015-04-02 | Discharge: 2015-04-02 | Disposition: A | Discharge status: Home / Self Care | Payer: BLUE CROSS/BLUE SHIELD | Source: Ambulatory Visit | Attending: Orthopedic Surgery | Admitting: Orthopedic Surgery

## 2015-04-02 ENCOUNTER — Encounter (HOSPITAL_COMMUNITY): Payer: Self-pay

## 2015-04-02 DIAGNOSIS — I498 Other specified cardiac arrhythmias: Secondary | ICD-10-CM | POA: Diagnosis not present

## 2015-04-02 DIAGNOSIS — M179 Osteoarthritis of knee, unspecified: Secondary | ICD-10-CM | POA: Insufficient documentation

## 2015-04-02 DIAGNOSIS — R9431 Abnormal electrocardiogram [ECG] [EKG]: Secondary | ICD-10-CM | POA: Insufficient documentation

## 2015-04-02 DIAGNOSIS — I10 Essential (primary) hypertension: Secondary | ICD-10-CM | POA: Diagnosis not present

## 2015-04-02 DIAGNOSIS — Z01818 Encounter for other preprocedural examination: Secondary | ICD-10-CM | POA: Diagnosis not present

## 2015-04-02 DIAGNOSIS — Z0183 Encounter for blood typing: Secondary | ICD-10-CM | POA: Diagnosis not present

## 2015-04-02 DIAGNOSIS — Z01812 Encounter for preprocedural laboratory examination: Secondary | ICD-10-CM | POA: Insufficient documentation

## 2015-04-02 DIAGNOSIS — I517 Cardiomegaly: Secondary | ICD-10-CM | POA: Diagnosis not present

## 2015-04-02 LAB — TYPE AND SCREEN
ABO/RH(D): B POS
ANTIBODY SCREEN: NEGATIVE

## 2015-04-02 LAB — CBC WITH DIFFERENTIAL/PLATELET
Basophils Absolute: 0.1 10*3/uL (ref 0.0–0.1)
Basophils Relative: 1 % (ref 0–1)
EOS PCT: 5 % (ref 0–5)
Eosinophils Absolute: 0.4 10*3/uL (ref 0.0–0.7)
HEMATOCRIT: 42.4 % (ref 36.0–46.0)
HEMOGLOBIN: 14.1 g/dL (ref 12.0–15.0)
Lymphocytes Relative: 35 % (ref 12–46)
Lymphs Abs: 2.4 10*3/uL (ref 0.7–4.0)
MCH: 31.8 pg (ref 26.0–34.0)
MCHC: 33.3 g/dL (ref 30.0–36.0)
MCV: 95.5 fL (ref 78.0–100.0)
MONO ABS: 0.6 10*3/uL (ref 0.1–1.0)
Monocytes Relative: 8 % (ref 3–12)
NEUTROS ABS: 3.5 10*3/uL (ref 1.7–7.7)
NEUTROS PCT: 51 % (ref 43–77)
Platelets: 263 10*3/uL (ref 150–400)
RBC: 4.44 MIL/uL (ref 3.87–5.11)
RDW: 12.5 % (ref 11.5–15.5)
WBC: 6.9 10*3/uL (ref 4.0–10.5)

## 2015-04-02 LAB — BASIC METABOLIC PANEL
Anion gap: 10 (ref 5–15)
BUN: 18 mg/dL (ref 6–20)
CHLORIDE: 101 mmol/L (ref 101–111)
CO2: 28 mmol/L (ref 22–32)
Calcium: 9.6 mg/dL (ref 8.9–10.3)
Creatinine, Ser: 0.82 mg/dL (ref 0.44–1.00)
GFR calc Af Amer: >60 mL/min (ref >60)
GFR calc non Af Amer: >60 mL/min (ref >60)
GLUCOSE: 80 mg/dL (ref 70–99)
POTASSIUM: 3.5 mmol/L (ref 3.5–5.1)
Sodium: 139 mmol/L (ref 135–145)

## 2015-04-02 LAB — URINALYSIS, ROUTINE W REFLEX MICROSCOPIC
Bilirubin Urine: NEGATIVE
GLUCOSE, UA: NEGATIVE mg/dL
Hgb urine dipstick: NEGATIVE
Ketones, ur: NEGATIVE mg/dL
NITRITE: NEGATIVE
Protein, ur: NEGATIVE mg/dL
SPECIFIC GRAVITY, URINE: 1.019 (ref 1.005–1.030)
Urobilinogen, UA: 0.2 mg/dL (ref 0.0–1.0)
pH: 6 (ref 5.0–8.0)

## 2015-04-02 LAB — URINE MICROSCOPIC-ADD ON

## 2015-04-02 LAB — ABO/RH: ABO/RH(D): B POS

## 2015-04-02 LAB — SURGICAL PCR SCREEN
MRSA, PCR: NEGATIVE
Staphylococcus aureus: NEGATIVE

## 2015-04-02 LAB — PROTIME-INR
INR: 0.96 (ref 0.00–1.49)
PROTHROMBIN TIME: 12.9 s (ref 11.6–15.2)

## 2015-04-02 LAB — APTT: aPTT: 27 seconds (ref 24–37)

## 2015-04-02 MED ORDER — CHLORHEXIDINE GLUCONATE 4 % EX LIQD: 60.0000 mL | Freq: Once | CUTANEOUS | Status: DC

## 2015-04-02 NOTE — Pre-Procedure Instructions (Signed)
Delbert PhenixDAun K Cuoco  04/02/2015   Your procedure is scheduled on:  Apr 09, 2015  [facility] at 5:30 AM.  Call this number if you have problems the morning of surgery: [phone]   Remember:   Do not eat food or drink liquids after midnight.   Take these medicines the morning of surgery with A SIP OF WATER: cetirizine (ZYRTEC)   STOP ONE WEEK PRIOR TO SURGERY:Diclofenac Sodium 2 % SOLN, ibuprofen (ADVIL,MOTRIN) 200 MG tablet, meloxicam (MOBIC), VITAMIN E   Do not wear jewelry, make-up or nail polish.  Do not wear lotions, powders, or perfumes. You may wear deodorant.  Do not shave 48 hours prior to surgery. Men may shave face and neck.  Do not bring valuables to the hospital.  [facility] is not responsible                  for any belongings or valuables.               Contacts, dentures or bridgework may not be worn into surgery.  Leave suitcase in the car. After surgery it may be brought to your room.  For patients admitted to the hospital, discharge time is determined by your                treatment team.               Patients discharged the day of surgery will not be allowed to drive  home.  Name and phone number of your driver:   Special Instructions:    Please read over the following fact sheets that you were given: Pain Booklet, Coughing and Deep Breathing, Blood Transfusion Information and Surgical Site Infection Prevention

## 2015-04-03 NOTE — Progress Notes (Signed)
Anesthesia Chart Review:  Pt is 59 year old female scheduled for R total knee arthroplasty on 04/09/2015 with Dr. Turner Danielsowan.   PMH includes: HTN. Never smoker. BMI 31  Preoperative labs reviewed.    Chest x-ray reviewed. Mild cardiomegaly. No evidence of CHF.   EKG: NSR. Possible Anterior infarct, age undetermined. No old tracing for comparison.  If no changes, I anticipate pt can proceed with surgery as scheduled.   Rica Mastngela Joakim Huesman, FNP-BC [facility] Phone: [phone] 04/03/2015 4:29 PM

## 2015-04-06 NOTE — H&P (Signed)
TOTAL KNEE ADMISSION H&P  Patient is being admitted for right total knee arthroplasty.  Subjective:  Chief Complaint:right knee pain.  HPI: Autumn Pittman, 59 y.o. female, has a history of pain and functional disability in the right knee due to arthritis and has failed non-surgical conservative treatments for greater than 12 weeks to includeNSAID's and/or analgesics, viscosupplementation injections, use of assistive devices, weight reduction as appropriate and activity modification.  Onset of symptoms was gradual, starting 1 years ago with gradually worsening course since that time. The patient noted no past surgery on the right knee(s).  Patient currently rates pain in the right knee(s) at 10 out of 10 with activity. Patient has night pain, worsening of pain with activity and weight bearing, pain that interferes with activities of daily living and pain with passive range of motion.  Patient has evidence of periarticular osteophytes and joint space narrowing by imaging studies. There is no active infection.  Patient Active Problem List   Diagnosis Date Noted  . Bilateral knee pain 03/31/2013  . Pes anserine bursitis 02/08/2013  . Patella, chondromalacia 01/04/2013  . OA (osteoarthritis) of knee 04/22/2011  . ARTHRITIS, RIGHT KNEE 09/23/2010  . DERANGEMENT MENISCUS 09/23/2010   Past Medical History  Diagnosis Date  . HTN (hypertension)     Past Surgical History  Procedure Laterality Date  . Varicose vein removal      rt leg   . Eye surgery Bilateral 02/2008    lasik     No prescriptions prior to admission   Allergies  Allergen Reactions  . Relafen [Nabumetone] Diarrhea    History  Substance Use Topics  . Smoking status: Never Smoker   . Smokeless tobacco: Never Used  . Alcohol Use: 0.0 oz/week    0 Standard drinks or equivalent per week    No family history on file.   Review of Systems  Constitutional: Negative.   HENT: Negative.   Eyes: Negative.   Respiratory: Negative.    Cardiovascular: Negative.   Gastrointestinal: Negative.   Genitourinary: Negative.   Musculoskeletal: Positive for joint pain.  Skin: Negative.   Neurological: Negative.   Endo/Heme/Allergies: Negative.   Psychiatric/Behavioral: Negative.     Objective:  Physical Exam  Constitutional: She is oriented to person, place, and time. She appears well-developed and well-nourished.  HENT:  Head: Normocephalic and atraumatic.  Eyes: Pupils are equal, round, and reactive to light.  Neck: Normal range of motion. Neck supple.  Cardiovascular: Intact distal pulses.   Respiratory: Effort normal.  Musculoskeletal: She exhibits tenderness.  Examination of right knee reveals mild effusion of the right knee.  Tender to palpation of the medial joint line of right knee.  Full range of motion.  Negative McMurray's.  Neurological: She is alert and oriented to person, place, and time.  Skin: Skin is warm and dry.  Psychiatric: She has a normal mood and affect. Her behavior is normal. Judgment and thought content normal.    Vital signs in last 24 hours:    Labs:   Estimated body mass index is 30.79 kg/(m^2) as calculated from the following:   Height as of 11/29/14: 5\' 5"  (1.651 m).   Weight as of 11/29/14: 83.915 kg (185 lb).   Imaging Review Plain radiographs demonstrate AP view of x-ray revealed bone-on-bone arthritis of the medial compartment of the right knee.  Sunrise view of x-ray revealed osteophytes at the patellofemoral joint region.  Assessment/Plan:  End stage arthritis, right knee   The patient history, physical examination,  clinical judgment of the provider and imaging studies are consistent with end stage degenerative joint disease of the right knee(s) and total knee arthroplasty is deemed medically necessary. The treatment options including medical management, injection therapy arthroscopy and arthroplasty were discussed at length. The risks and benefits of total knee arthroplasty  were presented and reviewed. The risks due to aseptic loosening, infection, stiffness, patella tracking problems, thromboembolic complications and other imponderables were discussed. The patient acknowledged the explanation, agreed to proceed with the plan and consent was signed. Patient is being admitted for inpatient treatment for surgery, pain control, PT, OT, prophylactic antibiotics, VTE prophylaxis, progressive ambulation and ADL's and discharge planning. The patient is planning to be discharged home with home health services

## 2015-04-08 MED ORDER — TRANEXAMIC ACID 1000 MG/10ML IV SOLN
1000.0000 mg | INTRAVENOUS | Status: AC
  Administered 2015-04-09: 1000 mg via INTRAVENOUS
  Filled 2015-04-08 (×2): qty 10

## 2015-04-09 ENCOUNTER — Inpatient Hospital Stay (HOSPITAL_COMMUNITY): Payer: BLUE CROSS/BLUE SHIELD | Admitting: Emergency Medicine

## 2015-04-09 ENCOUNTER — Inpatient Hospital Stay (HOSPITAL_COMMUNITY)
Admission: RE | Admit: 2015-04-09 | Discharge: 2015-04-11 | DRG: 470 | Disposition: A | Discharge status: Skilled Nursing Facility | Payer: BLUE CROSS/BLUE SHIELD | Source: Ambulatory Visit | Attending: Orthopedic Surgery | Admitting: Orthopedic Surgery

## 2015-04-09 ENCOUNTER — Encounter (HOSPITAL_COMMUNITY): Payer: Self-pay | Admitting: *Deleted

## 2015-04-09 ENCOUNTER — Encounter (HOSPITAL_COMMUNITY)
Admission: RE | Disposition: A | Discharge status: Skilled Nursing Facility | Payer: Self-pay | Source: Ambulatory Visit | Attending: Orthopedic Surgery

## 2015-04-09 ENCOUNTER — Inpatient Hospital Stay (HOSPITAL_COMMUNITY): Payer: BLUE CROSS/BLUE SHIELD | Admitting: Anesthesiology

## 2015-04-09 DIAGNOSIS — D62 Acute posthemorrhagic anemia: Secondary | ICD-10-CM | POA: Diagnosis not present

## 2015-04-09 DIAGNOSIS — Z79899 Other long term (current) drug therapy: Secondary | ICD-10-CM

## 2015-04-09 DIAGNOSIS — M1711 Unilateral primary osteoarthritis, right knee: Principal | ICD-10-CM | POA: Diagnosis present

## 2015-04-09 DIAGNOSIS — Z7982 Long term (current) use of aspirin: Secondary | ICD-10-CM

## 2015-04-09 DIAGNOSIS — Z6831 Body mass index (BMI) 31.0-31.9, adult: Secondary | ICD-10-CM | POA: Diagnosis not present

## 2015-04-09 DIAGNOSIS — I10 Essential (primary) hypertension: Secondary | ICD-10-CM | POA: Diagnosis present

## 2015-04-09 DIAGNOSIS — M1710 Unilateral primary osteoarthritis, unspecified knee: Secondary | ICD-10-CM | POA: Diagnosis present

## 2015-04-09 DIAGNOSIS — M25561 Pain in right knee: Secondary | ICD-10-CM | POA: Diagnosis present

## 2015-04-09 HISTORY — PX: TOTAL KNEE ARTHROPLASTY: SHX125

## 2015-04-09 SURGERY — ARTHROPLASTY, KNEE, TOTAL
Anesthesia: Spinal | Site: Knee | Laterality: Right

## 2015-04-09 MED ORDER — SODIUM CHLORIDE 0.9 % IJ SOLN
INTRAMUSCULAR | Status: DC | PRN
  Administered 2015-04-09: 40 mL via INTRAVENOUS

## 2015-04-09 MED ORDER — HYDROCHLOROTHIAZIDE 25 MG PO TABS
25.0000 mg | ORAL_TABLET | Freq: Every day | ORAL | Status: DC
  Administered 2015-04-11: 25 mg via ORAL
  Filled 2015-04-09 (×3): qty 1

## 2015-04-09 MED ORDER — PROPOFOL 10 MG/ML IV BOLUS
INTRAVENOUS | Status: AC
  Filled 2015-04-09: qty 20

## 2015-04-09 MED ORDER — ONDANSETRON HCL 4 MG/2ML IJ SOLN
INTRAMUSCULAR | Status: DC | PRN
  Administered 2015-04-09: 4 mg via INTRAVENOUS

## 2015-04-09 MED ORDER — SENNOSIDES-DOCUSATE SODIUM 8.6-50 MG PO TABS: 1.0000 | ORAL_TABLET | Freq: Every evening | ORAL | Status: DC | PRN

## 2015-04-09 MED ORDER — METHOCARBAMOL 1000 MG/10ML IJ SOLN
500.0000 mg | Freq: Four times a day (QID) | INTRAMUSCULAR | Status: DC | PRN
  Administered 2015-04-09 (×2): 500 mg via INTRAVENOUS
  Filled 2015-04-09 (×3): qty 5

## 2015-04-09 MED ORDER — FENTANYL CITRATE (PF) 250 MCG/5ML IJ SOLN
INTRAMUSCULAR | Status: AC
  Filled 2015-04-09: qty 5

## 2015-04-09 MED ORDER — MIDAZOLAM HCL 2 MG/2ML IJ SOLN
INTRAMUSCULAR | Status: AC
  Filled 2015-04-09: qty 2

## 2015-04-09 MED ORDER — SUCCINYLCHOLINE CHLORIDE 20 MG/ML IJ SOLN
INTRAMUSCULAR | Status: AC
  Filled 2015-04-09: qty 1

## 2015-04-09 MED ORDER — ALUM & MAG HYDROXIDE-SIMETH 200-200-20 MG/5ML PO SUSP: 30.0000 mL | ORAL | Status: DC | PRN

## 2015-04-09 MED ORDER — DIPHENHYDRAMINE HCL 50 MG/ML IJ SOLN
INTRAMUSCULAR | Status: AC
  Filled 2015-04-09: qty 1

## 2015-04-09 MED ORDER — FENTANYL CITRATE (PF) 100 MCG/2ML IJ SOLN
INTRAMUSCULAR | Status: DC | PRN
  Administered 2015-04-09 (×2): 50 ug via INTRAVENOUS
  Administered 2015-04-09: 100 ug via INTRAVENOUS
  Administered 2015-04-09: 50 ug via INTRAVENOUS
  Administered 2015-04-09: 100 ug via INTRAVENOUS
  Administered 2015-04-09 (×4): 50 ug via INTRAVENOUS

## 2015-04-09 MED ORDER — MELOXICAM 15 MG PO TABS
15.0000 mg | ORAL_TABLET | Freq: Every day | ORAL | Status: DC
  Administered 2015-04-11: 15 mg via ORAL
  Filled 2015-04-09 (×3): qty 1

## 2015-04-09 MED ORDER — PHENOL 1.4 % MT LIQD: 1.0000 | OROMUCOSAL | Status: DC | PRN

## 2015-04-09 MED ORDER — SODIUM CHLORIDE 0.9 % IJ SOLN
INTRAMUSCULAR | Status: AC
  Filled 2015-04-09: qty 10

## 2015-04-09 MED ORDER — CEFUROXIME SODIUM 1.5 G IJ SOLR
INTRAMUSCULAR | Status: DC | PRN
  Administered 2015-04-09: 1.5 g

## 2015-04-09 MED ORDER — BUPIVACAINE LIPOSOME 1.3 % IJ SUSP
INTRAMUSCULAR | Status: DC | PRN
  Administered 2015-04-09: 20 mL

## 2015-04-09 MED ORDER — HYDROMORPHONE HCL 1 MG/ML IJ SOLN
0.5000 mg | INTRAMUSCULAR | Status: DC | PRN
  Administered 2015-04-09 – 2015-04-11 (×5): 1 mg via INTRAVENOUS
  Filled 2015-04-09 (×6): qty 1

## 2015-04-09 MED ORDER — PROPOFOL 10 MG/ML IV BOLUS
INTRAVENOUS | Status: DC | PRN
  Administered 2015-04-09: 160 mg via INTRAVENOUS

## 2015-04-09 MED ORDER — LORATADINE 10 MG PO TABS
10.0000 mg | ORAL_TABLET | Freq: Every day | ORAL | Status: DC
  Filled 2015-04-09 (×3): qty 1

## 2015-04-09 MED ORDER — ONDANSETRON HCL 4 MG PO TABS: 4.0000 mg | ORAL_TABLET | Freq: Four times a day (QID) | ORAL | Status: DC | PRN

## 2015-04-09 MED ORDER — DEXAMETHASONE SODIUM PHOSPHATE 4 MG/ML IJ SOLN
INTRAMUSCULAR | Status: AC
  Filled 2015-04-09: qty 1

## 2015-04-09 MED ORDER — ACETAMINOPHEN 10 MG/ML IV SOLN
1000.0000 mg | INTRAVENOUS | Status: AC
  Administered 2015-04-09: 1000 mg via INTRAVENOUS
  Filled 2015-04-09: qty 100

## 2015-04-09 MED ORDER — BISACODYL 5 MG PO TBEC
5.0000 mg | DELAYED_RELEASE_TABLET | Freq: Every day | ORAL | Status: DC | PRN
  Filled 2015-04-09: qty 1

## 2015-04-09 MED ORDER — METOCLOPRAMIDE HCL 5 MG PO TABS: 5.0000 mg | ORAL_TABLET | Freq: Three times a day (TID) | ORAL | Status: DC | PRN

## 2015-04-09 MED ORDER — LACTATED RINGERS IV SOLN
INTRAVENOUS | Status: DC | PRN
  Administered 2015-04-09 (×2): via INTRAVENOUS

## 2015-04-09 MED ORDER — CEFAZOLIN SODIUM-DEXTROSE 2-3 GM-% IV SOLR
2.0000 g | INTRAVENOUS | Status: AC
Start: 2015-04-09 — End: 2015-04-09
  Administered 2015-04-09: 2 g via INTRAVENOUS

## 2015-04-09 MED ORDER — METOCLOPRAMIDE HCL 5 MG/ML IJ SOLN: 5.0000 mg | Freq: Three times a day (TID) | INTRAMUSCULAR | Status: DC | PRN

## 2015-04-09 MED ORDER — DIPHENHYDRAMINE HCL 50 MG/ML IJ SOLN
INTRAMUSCULAR | Status: DC | PRN
  Administered 2015-04-09: 12.5 mg via INTRAVENOUS

## 2015-04-09 MED ORDER — DOCUSATE SODIUM 100 MG PO CAPS
100.0000 mg | ORAL_CAPSULE | Freq: Two times a day (BID) | ORAL | Status: DC
  Administered 2015-04-09 – 2015-04-11 (×4): 100 mg via ORAL
  Filled 2015-04-09 (×5): qty 1

## 2015-04-09 MED ORDER — MEPERIDINE HCL 25 MG/ML IJ SOLN: 6.2500 mg | INTRAMUSCULAR | Status: DC | PRN

## 2015-04-09 MED ORDER — FLEET ENEMA 7-19 GM/118ML RE ENEM: 1.0000 | ENEMA | Freq: Once | RECTAL | Status: AC | PRN

## 2015-04-09 MED ORDER — OXYCODONE HCL 5 MG PO TABS
5.0000 mg | ORAL_TABLET | ORAL | Status: DC | PRN
  Administered 2015-04-09 – 2015-04-11 (×9): 10 mg via ORAL
  Filled 2015-04-09 (×10): qty 2

## 2015-04-09 MED ORDER — MIDAZOLAM HCL 5 MG/5ML IJ SOLN
INTRAMUSCULAR | Status: DC | PRN
  Administered 2015-04-09: 2 mg via INTRAVENOUS

## 2015-04-09 MED ORDER — ASPIRIN EC 325 MG PO TBEC: 325.0000 mg | DELAYED_RELEASE_TABLET | Freq: Two times a day (BID) | ORAL | Status: AC

## 2015-04-09 MED ORDER — ACETAMINOPHEN 325 MG PO TABS: 650.0000 mg | ORAL_TABLET | Freq: Four times a day (QID) | ORAL | Status: DC | PRN

## 2015-04-09 MED ORDER — KCL IN DEXTROSE-NACL 20-5-0.45 MEQ/L-%-% IV SOLN
INTRAVENOUS | Status: DC
  Administered 2015-04-09: 125 mL/h via INTRAVENOUS
  Administered 2015-04-10: 01:00:00 via INTRAVENOUS
  Filled 2015-04-09 (×9): qty 1000

## 2015-04-09 MED ORDER — HYDROMORPHONE HCL 1 MG/ML IJ SOLN
INTRAMUSCULAR | Status: AC
  Filled 2015-04-09: qty 1

## 2015-04-09 MED ORDER — ASPIRIN EC 325 MG PO TBEC
325.0000 mg | DELAYED_RELEASE_TABLET | Freq: Every day | ORAL | Status: DC
  Administered 2015-04-10 – 2015-04-11 (×2): 325 mg via ORAL
  Filled 2015-04-09 (×2): qty 1

## 2015-04-09 MED ORDER — LABETALOL HCL 5 MG/ML IV SOLN
INTRAVENOUS | Status: DC | PRN
  Administered 2015-04-09: 5 mg via INTRAVENOUS

## 2015-04-09 MED ORDER — HYDROMORPHONE HCL 1 MG/ML IJ SOLN
INTRAMUSCULAR | Status: DC | PRN
  Administered 2015-04-09 (×2): 0.5 mg via INTRAVENOUS

## 2015-04-09 MED ORDER — EPHEDRINE SULFATE 50 MG/ML IJ SOLN
INTRAMUSCULAR | Status: AC
  Filled 2015-04-09: qty 1

## 2015-04-09 MED ORDER — GLYCOPYRROLATE 0.2 MG/ML IJ SOLN
INTRAMUSCULAR | Status: AC
  Filled 2015-04-09: qty 1

## 2015-04-09 MED ORDER — SODIUM CHLORIDE 0.9 % IR SOLN
Status: DC | PRN
  Administered 2015-04-09 (×2): 1000 mL

## 2015-04-09 MED ORDER — DEXAMETHASONE SODIUM PHOSPHATE 4 MG/ML IJ SOLN
INTRAMUSCULAR | Status: DC | PRN
  Administered 2015-04-09: 4 mg via INTRAVENOUS

## 2015-04-09 MED ORDER — METHOCARBAMOL 500 MG PO TABS
500.0000 mg | ORAL_TABLET | Freq: Four times a day (QID) | ORAL | Status: DC | PRN
  Administered 2015-04-10 – 2015-04-11 (×5): 500 mg via ORAL
  Filled 2015-04-09 (×6): qty 1

## 2015-04-09 MED ORDER — CEFAZOLIN SODIUM-DEXTROSE 2-3 GM-% IV SOLR
INTRAVENOUS | Status: AC
  Filled 2015-04-09: qty 50

## 2015-04-09 MED ORDER — DEXTROSE-NACL 5-0.45 % IV SOLN: INTRAVENOUS | Status: DC

## 2015-04-09 MED ORDER — 0.9 % SODIUM CHLORIDE (POUR BTL) OPTIME
TOPICAL | Status: DC | PRN
  Administered 2015-04-09: 1000 mL

## 2015-04-09 MED ORDER — KCL IN DEXTROSE-NACL 20-5-0.45 MEQ/L-%-% IV SOLN
INTRAVENOUS | Status: AC
  Filled 2015-04-09: qty 1000

## 2015-04-09 MED ORDER — PROMETHAZINE HCL 25 MG/ML IJ SOLN: 6.2500 mg | INTRAMUSCULAR | Status: DC | PRN

## 2015-04-09 MED ORDER — ROCURONIUM BROMIDE 50 MG/5ML IV SOLN
INTRAVENOUS | Status: AC
  Filled 2015-04-09: qty 1

## 2015-04-09 MED ORDER — HYDROMORPHONE HCL 1 MG/ML IJ SOLN
0.2500 mg | INTRAMUSCULAR | Status: DC | PRN
  Administered 2015-04-09 (×2): 0.5 mg via INTRAVENOUS

## 2015-04-09 MED ORDER — ONDANSETRON HCL 4 MG/2ML IJ SOLN
INTRAMUSCULAR | Status: AC
  Filled 2015-04-09: qty 2

## 2015-04-09 MED ORDER — PHENYLEPHRINE 40 MCG/ML (10ML) SYRINGE FOR IV PUSH (FOR BLOOD PRESSURE SUPPORT)
PREFILLED_SYRINGE | INTRAVENOUS | Status: AC
  Filled 2015-04-09: qty 10

## 2015-04-09 MED ORDER — OXYCODONE-ACETAMINOPHEN 5-325 MG PO TABS: 1.0000 | ORAL_TABLET | ORAL | Status: DC | PRN

## 2015-04-09 MED ORDER — ONDANSETRON HCL 4 MG/2ML IJ SOLN: 4.0000 mg | Freq: Four times a day (QID) | INTRAMUSCULAR | Status: DC | PRN

## 2015-04-09 MED ORDER — DIPHENHYDRAMINE HCL 12.5 MG/5ML PO ELIX: 12.5000 mg | ORAL_SOLUTION | ORAL | Status: DC | PRN

## 2015-04-09 MED ORDER — CEFUROXIME SODIUM 1.5 G IJ SOLR
INTRAMUSCULAR | Status: AC
  Filled 2015-04-09: qty 1.5

## 2015-04-09 MED ORDER — LIDOCAINE HCL (CARDIAC) 20 MG/ML IV SOLN
INTRAVENOUS | Status: AC
  Filled 2015-04-09: qty 5

## 2015-04-09 MED ORDER — ACETAMINOPHEN 650 MG RE SUPP: 650.0000 mg | Freq: Four times a day (QID) | RECTAL | Status: DC | PRN

## 2015-04-09 MED ORDER — BUPIVACAINE LIPOSOME 1.3 % IJ SUSP
20.0000 mL | Freq: Once | INTRAMUSCULAR | Status: DC
  Filled 2015-04-09: qty 20

## 2015-04-09 MED ORDER — MIDAZOLAM HCL 2 MG/2ML IJ SOLN: 0.5000 mg | Freq: Once | INTRAMUSCULAR | Status: DC | PRN

## 2015-04-09 MED ORDER — MENTHOL 3 MG MT LOZG: 1.0000 | LOZENGE | OROMUCOSAL | Status: DC | PRN

## 2015-04-09 MED ORDER — METHOCARBAMOL 500 MG PO TABS: 500.0000 mg | ORAL_TABLET | Freq: Two times a day (BID) | ORAL | Status: AC

## 2015-04-09 SURGICAL SUPPLY — 62 items
BANDAGE ELASTIC 6 VELCRO ST LF (GAUZE/BANDAGES/DRESSINGS) ×2 IMPLANT
BANDAGE ESMARK 6X9 LF (GAUZE/BANDAGES/DRESSINGS) ×1 IMPLANT
BLADE SAG 18X100X1.27 (BLADE) ×2 IMPLANT
BLADE SAW SGTL 13X75X1.27 (BLADE) ×2 IMPLANT
BLADE SURG ROTATE 9660 (MISCELLANEOUS) IMPLANT
BNDG ELASTIC 6X10 VLCR STRL LF (GAUZE/BANDAGES/DRESSINGS) ×2 IMPLANT
BNDG ESMARK 6X9 LF (GAUZE/BANDAGES/DRESSINGS) ×2
BOWL SMART MIX CTS (DISPOSABLE) ×2 IMPLANT
CAPT KNEE TOTAL 3 ATTUNE ×2 IMPLANT
CEMENT HV SMART SET (Cement) ×4 IMPLANT
COVER SURGICAL LIGHT HANDLE (MISCELLANEOUS) ×2 IMPLANT
CUFF TOURNIQUET SINGLE 34IN LL (TOURNIQUET CUFF) IMPLANT
CUFF TOURNIQUET SINGLE 44IN (TOURNIQUET CUFF) IMPLANT
DRAPE EXTREMITY T 121X128X90 (DRAPE) ×2 IMPLANT
DRAPE IMP U-DRAPE 54X76 (DRAPES) ×2 IMPLANT
DRAPE U-SHAPE 47X51 STRL (DRAPES) ×2 IMPLANT
DURAPREP 26ML APPLICATOR (WOUND CARE) ×4 IMPLANT
ELECT REM PT RETURN 9FT ADLT (ELECTROSURGICAL) ×2
ELECTRODE REM PT RTRN 9FT ADLT (ELECTROSURGICAL) ×1 IMPLANT
EVACUATOR 1/8 PVC DRAIN (DRAIN) IMPLANT
GAUZE SPONGE 4X4 12PLY STRL (GAUZE/BANDAGES/DRESSINGS) ×2 IMPLANT
GAUZE XEROFORM 1X8 LF (GAUZE/BANDAGES/DRESSINGS) ×2 IMPLANT
GLOVE BIO SURGEON STRL SZ7.5 (GLOVE) ×2 IMPLANT
GLOVE BIO SURGEON STRL SZ8.5 (GLOVE) ×2 IMPLANT
GLOVE BIOGEL PI IND STRL 8 (GLOVE) ×1 IMPLANT
GLOVE BIOGEL PI IND STRL 9 (GLOVE) ×1 IMPLANT
GLOVE BIOGEL PI INDICATOR 8 (GLOVE) ×1
GLOVE BIOGEL PI INDICATOR 9 (GLOVE) ×1
GOWN STRL REUS W/ TWL LRG LVL3 (GOWN DISPOSABLE) ×1 IMPLANT
GOWN STRL REUS W/ TWL XL LVL3 (GOWN DISPOSABLE) ×2 IMPLANT
GOWN STRL REUS W/TWL LRG LVL3 (GOWN DISPOSABLE) ×1
GOWN STRL REUS W/TWL XL LVL3 (GOWN DISPOSABLE) ×2
HANDPIECE INTERPULSE COAX TIP (DISPOSABLE) ×1
HOOD PEEL AWAY FACE SHEILD DIS (HOOD) ×6 IMPLANT
KIT BASIN OR (CUSTOM PROCEDURE TRAY) ×2 IMPLANT
KIT ROOM TURNOVER OR (KITS) ×2 IMPLANT
MANIFOLD NEPTUNE II (INSTRUMENTS) ×2 IMPLANT
NDL SAFETY ECLIPSE 18X1.5 (NEEDLE) IMPLANT
NEEDLE 22X1 1/2 (OR ONLY) (NEEDLE) ×2 IMPLANT
NEEDLE HYPO 18GX1.5 SHARP (NEEDLE)
NEEDLE SPNL 18GX3.5 QUINCKE PK (NEEDLE) IMPLANT
NS IRRIG 1000ML POUR BTL (IV SOLUTION) ×2 IMPLANT
PACK TOTAL JOINT (CUSTOM PROCEDURE TRAY) ×2 IMPLANT
PACK UNIVERSAL I (CUSTOM PROCEDURE TRAY) ×2 IMPLANT
PAD ARMBOARD 7.5X6 YLW CONV (MISCELLANEOUS) ×4 IMPLANT
PADDING CAST COTTON 6X4 STRL (CAST SUPPLIES) ×2 IMPLANT
SET HNDPC FAN SPRY TIP SCT (DISPOSABLE) ×1 IMPLANT
SUCTION FRAZIER TIP 10 FR DISP (SUCTIONS) ×2 IMPLANT
SUT VIC AB 0 CT1 27 (SUTURE) ×1
SUT VIC AB 0 CT1 27XBRD ANBCTR (SUTURE) ×1 IMPLANT
SUT VIC AB 1 CTX 36 (SUTURE) ×1
SUT VIC AB 1 CTX36XBRD ANBCTR (SUTURE) ×1 IMPLANT
SUT VIC AB 2-0 CT1 27 (SUTURE) ×1
SUT VIC AB 2-0 CT1 TAPERPNT 27 (SUTURE) ×1 IMPLANT
SUT VIC AB 3-0 CT1 27 (SUTURE) ×1
SUT VIC AB 3-0 CT1 TAPERPNT 27 (SUTURE) ×1 IMPLANT
SUT VIC AB 3-0 FS2 27 (SUTURE) ×2 IMPLANT
SYR 30ML LL (SYRINGE) IMPLANT
SYR 50ML LL SCALE MARK (SYRINGE) ×2 IMPLANT
TOWEL OR 17X24 6PK STRL BLUE (TOWEL DISPOSABLE) ×2 IMPLANT
TOWEL OR 17X26 10 PK STRL BLUE (TOWEL DISPOSABLE) ×2 IMPLANT
WATER STERILE IRR 1000ML POUR (IV SOLUTION) ×6 IMPLANT

## 2015-04-09 NOTE — Op Note (Signed)
PATIENT ID:      Autumn PhenixDAun K Nakatani  MRN:     [id number] DOB/AGE:    04/16/1956 / 59 y.o.       OPERATIVE REPORT    DATE OF PROCEDURE:  04/09/2015       PREOPERATIVE DIAGNOSIS:   RIGHT KNEE END STAGE DEGENERATIVE JOINT DISEASE      Estimated body mass index is 31.13 kg/(m^2) as calculated from the following:   Height as of this encounter: 5' 5.5" (1.664 m).   Weight as of this encounter: 86.183 kg (190 lb).                                                        POSTOPERATIVE DIAGNOSIS:   RIGHT KNEE END STAGE DEGENERATIVE JOINT                                                                       PROCEDURE:  Procedure(s): RIGHT TOTAL KNEE ARTHROPLASTY Using DepuyAttune RP implants #5R Femur, #6Tibia, 5 mm Attune RP bearing, 38 Patella     SURGEON: Marylene Masek J    ASSISTANT:   Eric K. [employer] PA-C   (Present and scrubbed throughout the case, critical for assistance with exposure, retraction, instrumentation, and closure.)         ANESTHESIA: GLMA, Exparel  EBL: 300  FLUID REPLACEMENT: 1800 crystalloid  TOURNIQUET TIME: 15min  Drains: None  Tranexamic Acid: 1gm IV   COMPLICATIONS:  None         INDICATIONS FOR PROCEDURE: The patient has  RIGHT KNEE END STAGE DEGENERATIVE JOINT DISEASE, varus deformities, XR shows bone on bone arthritis. Patient has failed all conservative measures including anti-inflammatory medicines, narcotics, attempts at  exercise and weight loss, cortisone injections and viscosupplementation.  Risks and benefits of surgery have been discussed, questions answered.   DESCRIPTION OF PROCEDURE: The patient identified by armband, received  IV antibiotics, [facility]. Patient taken to the operating room, appropriate anesthetic  monitors were attached, and general endotracheal anesthesia induced with  the patient in supine position. Tourniquet  applied high to the operative thigh. Lateral post and foot positioner  applied to the table, the  lower extremity was then prepped and draped  in usual sterile fashion from the ankle to the tourniquet. Time-out procedure was performed. We began the operation, with the knee flexed 100 degrees, by making the anterior midline incision starting at handbreadth above the patella going over the patella 1 cm medial to and 4 cm distal to the tibial tubercle. Small bleeders in the skin and the  subcutaneous tissue identified and cauterized. Transverse retinaculum was incised and reflected medially and a medial parapatellar arthrotomy was accomplished. the patella was everted and theprepatellar fat pad resected. The superficial medial collateral  ligament was then elevated from anterior to posterior along the proximal  flare of the tibia and anterior half of the menisci resected. The knee was hyperflexed exposing bone on bone arthritis. Peripheral and notch osteophytes as well as the cruciate ligaments were then resected. We continued to  work our way around posteriorly along the proximal tibia, and externally  rotated the tibia subluxing it out from underneath the femur. A McHale  retractor was placed through the notch and a lateral Hohmann retractor  placed, and we then drilled through the proximal tibia in line with the  axis of the tibia followed by an intramedullary guide rod and 2-degree  posterior slope cutting guide. The tibial cutting guide, 3 degree posterior sloped, was pinned into place allowing resection of 4 mm of bone medially and about 10 mm of bone laterally. Satisfied with the tibial resection, we then  entered the distal femur 2 mm anterior to the PCL origin with the  intramedullary guide rod and applied the distal femoral cutting guide  set at 9mm, with 5 degrees of valgus. This was pinned along the  epicondylar axis. At this point, the distal femoral cut was accomplished without difficulty. We then sized for a #5R femoral component and pinned the guide in 3 degrees of external  rotation.The chamfer cutting guide was pinned into place. The anterior, posterior, and chamfer cuts were accomplished without difficulty followed by  the Attune RP box cutting guide and the box cut. We also removed posterior osteophytes from the posterior femoral condyles. At this  time, the knee was brought into full extension. We checked our  extension and flexion gaps and found them symmetric for a 6 mm bearing. Distracting in extension with a lamina spreader, the posterior horns of the menisci were removed, and Exparel, diluted to 60 cc, was injected into the capsule of the knee. The  posterior patella cut was accomplished with the 9.5 mm Attune cutting guide, sized at 38* dome, and the fixation pegs drilled.The knee  was then once again hyperflexed exposing the proximal tibia. We sized for a #6 tibial base plate, applied the smokestack and the conical reamer followed by the the Delta fin keel punch. We then hammered into place the Attune RP trial femoral component, inserted a  6 mm trial bearing, trial patellar button, and took the knee through range of motion from 0-130 degrees. No thumb pressure was required for patellar  Tracking. At this point, the limb was wrapped with an Esmarch bandage and the tourniquet inflated to 350 mmHg. All trial components were removed, mating surfaces irrigated with pulse lavage, and dried with suction and sponges. A double batch of DePuy HV cement with 1500 mg of Zinacef was mixed and applied to all bony metallic mating surfaces except for the posterior condyles of the femur itself. In order, we  hammered into place the tibial tray and removed excess cement, the femoral component and removed excess cement,  The 6  Attune RP bearing  was inserted, and the knee brought to full extension with compression.  The patellar button was clamped into place, and excess cement  removed. While the cement cured the wound was irrigated out with normal saline solution pulse lavage.  Ligament stability and patellar tracking were checked and found to be excellent. The parapatellar arthrotomy was closed with  running #1 Vicryl suture. The subcutaneous tissue with 0 and 2-0 undyed  Vicryl suture, and the skin with running 3-0 SQ vicryl. A dressing of Xeroform,  4 x 4, dressing sponges, Webril, and Ace wrap applied. The patient  awakened, extubated, and taken to recovery room without difficulty.   Leianne Callins J 04/09/2015, 9:20 AM

## 2015-04-09 NOTE — Progress Notes (Signed)
Orthopedic Tech Progress Note Patient Details:  Delbert PhenixDAun K Enrique 04-Jul-1956 [id number] CPM order for 10-40 but PA came around to PACU RN with verbal order for 0-60. Settings applied. OHF applied to bed.  CPM Right Knee CPM Right Knee: On Right Knee Flexion (Degrees): 60 Right Knee Extension (Degrees): 0   Asia R Thompson 04/09/2015, 10:54 AM

## 2015-04-09 NOTE — Anesthesia Preprocedure Evaluation (Addendum)
Anesthesia Evaluation  Patient identified by MRN, date of birth, ID band Patient awake    Reviewed: Allergy & Precautions, NPO status , Patient's Chart, lab work & pertinent test results  History of Anesthesia Complications Negative for: history of anesthetic complications  Airway Mallampati: II  TM Distance: >3 FB Neck ROM: Full    Dental  (+) Teeth Intact, Dental Advisory Given   Pulmonary neg pulmonary ROS,  breath sounds clear to auscultation        Cardiovascular Pt. on medications Rhythm:Regular Rate:Normal     Neuro/Psych negative neurological ROS     GI/Hepatic negative GI ROS, Neg liver ROS,   Endo/Other  Morbid obesity  Renal/GU negative Renal ROS     Musculoskeletal  (+) Arthritis -,   Abdominal (+) + obese,   Peds  Hematology negative hematology ROS (+)   Anesthesia Other Findings   Reproductive/Obstetrics negative OB ROS                            Anesthesia Physical Anesthesia Plan  ASA: II  Anesthesia Plan: Spinal   Post-op Pain Management:    Induction:   Airway Management Planned: Natural Airway and Simple Face Mask  Additional Equipment:   Intra-op Plan:   Post-operative Plan:   Informed Consent: I have reviewed the patients History and Physical, chart, labs and discussed the procedure including the risks, benefits and alternatives for the proposed anesthesia with the patient or authorized representative who has indicated his/her understanding and acceptance.   Dental advisory given  Plan Discussed with: CRNA and Surgeon  Anesthesia Plan Comments: (Plan routine monitors, SAB )        Anesthesia Quick Evaluation

## 2015-04-09 NOTE — Interval H&P Note (Signed)
History and Physical Interval Note:  04/09/2015 7:10 AM  Autumn Pittman  has presented today for surgery, with the diagnosis of RIGHT KNEE END STAGE DEGENERATIVE JOINT DISEASE  The various methods of treatment have been discussed with the patient and family. After consideration of risks, benefits and other options for treatment, the patient has consented to  Procedure(s): RIGHT TOTAL KNEE ARTHROPLASTY (Right) as a surgical intervention .  The patient's history has been reviewed, patient examined, no change in status, stable for surgery.  I have reviewed the patient's chart and labs.  Questions were answered to the patient's satisfaction.     Nestor LewandowskyOWAN,Aynsley Fleet J

## 2015-04-09 NOTE — Discharge Instructions (Signed)

## 2015-04-09 NOTE — Anesthesia Procedure Notes (Signed)
Procedure Name: LMA Insertion Date/Time: 04/09/2015 7:54 AM Performed by: Romie MinusOCK, Samiah Ricklefs K Pre-anesthesia Checklist: Patient identified, Emergency Drugs available, Suction available, Patient being monitored and Timeout performed Patient Re-evaluated:Patient Re-evaluated prior to inductionOxygen Delivery Method: Circle system utilized Preoxygenation: Pre-oxygenation with 100% oxygen Intubation Type: IV induction Ventilation: Mask ventilation without difficulty LMA: LMA inserted LMA Size: 4.0 Number of attempts: 1 Placement Confirmation: positive ETCO2,  CO2 detector and breath sounds checked- equal and bilateral Tube secured with: Tape Dental Injury: Teeth and Oropharynx as per pre-operative assessment

## 2015-04-09 NOTE — Evaluation (Signed)
Physical Therapy Evaluation Patient Details Name: Autumn PhenixDAun K Pittman MRN: [id number] DOB: May 06, 1956 Today's Date: 04/09/2015   History of Present Illness  Patient is Autumn 59 y/o female s/p R TKA. PMH of HTN.  Clinical Impression  Patient presents with pain and post surgical deficits RLE s/p R TKA. PTA, pt independent with all mobility. Pt tolerated short distance ambulation today. Education provided on HEP. Pt will not have 24/7 S at home at d/c as spouse works- wants to go to rehab. Would benefit from ST SNF to improve transfers, gait, balance and mobility so pt can maximize independence and return to PLOF.    Follow Up Recommendations SNF    Equipment Recommendations  None recommended by PT    Recommendations for Other Services OT consult     Precautions / Restrictions Precautions Precautions: Knee Precaution Booklet Issued: Yes (comment) Precaution Comments: Reviewed HEP and not placing pillow under knee.  Restrictions Weight Bearing Restrictions: Yes RLE Weight Bearing: Weight bearing as tolerated      Mobility  Bed Mobility Overal bed mobility: Needs Assistance Bed Mobility: Supine to Sit;Sit to Supine     Supine to sit: Min guard;HOB elevated Sit to supine: Min guard;HOB elevated   General bed mobility comments: Use of rail for support. Cues for technique.   Transfers Overall transfer level: Needs assistance Equipment used: Rolling walker (2 wheeled) Transfers: Sit to/from Stand Sit to Stand: Min guard         General transfer comment: Min guard to rise from EOB x1, from [facility].   Ambulation/Gait Ambulation/Gait assistance: Min guard Ambulation Distance (Feet): 15 Feet (x2 bouts) Assistive device: Rolling walker (2 wheeled) Gait Pattern/deviations: Step-to pattern;Decreased stride length;Decreased stance time - right;Decreased step length - left;Trunk flexed   Gait velocity interpretation: Below normal speed for age/gender General Gait Details: Pt with slow,  guarded gait. Increased WB through BUEs.   Stairs            Wheelchair Mobility    Modified Rankin (Stroke Patients Only)       Balance Overall balance assessment: Needs assistance Sitting-balance support: Feet supported;Single extremity supported Sitting balance-Leahy Scale: Fair     Standing balance support: During functional activity Standing balance-Leahy Scale: Fair Standing balance comment: Able to perform dynamic standing activities - washing hands without UE support for short period.                             Pertinent Vitals/Pain Pain Assessment: 0-10 Pain Score: 8  Pain Location: right knee Pain Descriptors / Indicators: Sore Pain Intervention(s): Limited activity within patient's tolerance;Monitored during session;Repositioned;Premedicated before session;Ice applied    Home Living Family/patient expects to be discharged to:: Skilled nursing facility Living Arrangements: Spouse/significant other Available Help at Discharge: Family;Available PRN/intermittently (Spouse works during the day.) Type of Home: House Home Access: Stairs to enter Entrance Stairs-Rails: Right Entrance Stairs-Number of Steps: 4 Home Layout: One level Home Equipment: Environmental consultantWalker - 2 wheels;Bedside commode      Prior Function Level of Independence: Independent with assistive device(s)         Comments: Pt used SPC PRN PTA otherwise very independent.     Hand Dominance        Extremity/Trunk Assessment   Upper Extremity Assessment: Defer to OT evaluation           Lower Extremity Assessment: RLE deficits/detail RLE Deficits / Details: Grossly ~3/5 throughout with pain.  Communication   Communication: No difficulties  Cognition Arousal/Alertness: Awake/alert Behavior During Therapy: WFL for tasks assessed/performed Overall Cognitive Status: Within Functional Limits for tasks assessed                      General Comments       Exercises Total Joint Exercises Ankle Circles/Pumps: Both;20 reps;Supine Quad Sets: 5 reps;Right;Supine (3-5 sec hold) Heel Slides: Right;5 reps;Supine      Assessment/Plan    PT Assessment Patient needs continued PT services  PT Diagnosis Difficulty walking;Acute pain;Generalized weakness   PT Problem List Decreased strength;Pain;Decreased range of motion;Impaired sensation;Decreased activity tolerance;Decreased balance;Decreased mobility  PT Treatment Interventions Balance training;Gait training;Functional mobility training;Patient/family education;Therapeutic activities;Therapeutic exercise;DME instruction   PT Goals (Current goals can be found in the Care Plan section) Acute Rehab PT Goals Patient Stated Goal: to go to rehab prior to returning home PT Goal Formulation: With patient/family Time For Goal Achievement: 04/23/15 Potential to Achieve Goals: Good    Frequency 7X/week   Barriers to discharge Decreased caregiver support Pt will be home alone during the day    Co-evaluation               End of Session Equipment Utilized During Treatment: Gait belt Activity Tolerance: Patient tolerated treatment well;Patient limited by pain Patient left: in bed;with call bell/phone within reach;with bed alarm set;with family/visitor present           Time: [id number] PT Time Calculation (min) (ACUTE ONLY): 30 min   Charges:   PT Evaluation $Initial PT Evaluation Tier I: 1 Procedure PT Treatments $Therapeutic Activity: 8-22 mins   PT G Codes:        Autumn Pittman Autumn Pittman 04/09/2015, 2:44 PM Mylo RedShauna Keagon Glascoe, PT, DPT [phone]

## 2015-04-09 NOTE — Transfer of Care (Signed)
Immediate Anesthesia Transfer of Care Note  Patient: Autumn PhenixAun K Pittman  Procedure(s) Performed: Procedure(s): RIGHT TOTAL KNEE ARTHROPLASTY (Right)  Patient Location: PACU  Anesthesia Type:General  Level of Consciousness: awake, oriented and patient cooperative  Airway & Oxygen Therapy: Patient Spontanous Breathing and Patient connected to nasal cannula oxygen  Post-op Assessment: Report given to RN and Post -op Vital signs reviewed and stable  Post vital signs: Reviewed  Last Vitals:  Filed Vitals:   04/09/15 0549  BP: 170/99  Pulse: 81  Temp: 37.3 C  Resp: 18    Complications: No apparent anesthesia complications

## 2015-04-09 NOTE — Anesthesia Postprocedure Evaluation (Signed)
  Anesthesia Post-op Note  Patient: Autumn PhenixAun K Pittman  Procedure(s) Performed: Procedure(s): RIGHT TOTAL KNEE ARTHROPLASTY (Right)  Patient Location: PACU  Anesthesia Type:General  Level of Consciousness: awake, alert , oriented and patient cooperative  Airway and Oxygen Therapy: Patient Spontanous Breathing  Post-op Pain: mild  Post-op Assessment: Post-op Vital signs reviewed, Patient's Cardiovascular Status Stable, Respiratory Function Stable, Patent Airway, No signs of Nausea or vomiting and Pain level controlled  Post-op Vital Signs: Reviewed and stable  Last Vitals:  Filed Vitals:   04/09/15 1141  BP: 171/86  Pulse: 69  Temp: 36.5 C  Resp: 14    Complications: No apparent anesthesia complications

## 2015-04-10 LAB — BASIC METABOLIC PANEL
ANION GAP: 7 (ref 5–15)
BUN: 12 mg/dL (ref 6–20)
CO2: 30 mmol/L (ref 22–32)
CREATININE: 0.84 mg/dL (ref 0.44–1.00)
Calcium: 8.8 mg/dL — ABNORMAL LOW (ref 8.9–10.3)
Chloride: 102 mmol/L (ref 101–111)
GLUCOSE: 116 mg/dL — AB (ref 65–99)
Potassium: 4 mmol/L (ref 3.5–5.1)
Sodium: 139 mmol/L (ref 135–145)

## 2015-04-10 LAB — CBC
HEMATOCRIT: 30.5 % — AB (ref 36.0–46.0)
HEMOGLOBIN: 9.9 g/dL — AB (ref 12.0–15.0)
MCH: 31 pg (ref 26.0–34.0)
MCHC: 32.5 g/dL (ref 30.0–36.0)
MCV: 95.6 fL (ref 78.0–100.0)
Platelets: 240 10*3/uL (ref 150–400)
RBC: 3.19 MIL/uL — AB (ref 3.87–5.11)
RDW: 12.8 % (ref 11.5–15.5)
WBC: 9.6 10*3/uL (ref 4.0–10.5)

## 2015-04-10 NOTE — Progress Notes (Signed)
CSW met with patient and husband today to discuss SNF options. She wants the [facility] but they are not contracted with her insurance.  Her 2nd choice is [facility]-  This facility iscurrently attempting to obtain information re: BCBS authorization and will call CSW asap about this.  Anticipate stability for d/c tomorrow.  Full assessment to follow. Fl2 on chart for MD's signature.  Lorie Phenix. Pauline Good, [place]

## 2015-04-10 NOTE — Progress Notes (Signed)
Orthopedic Tech Progress Note Patient Details:  Autumn PhenixDAun K Bredeson August 26, 1956 [id number]  Patient ID: Autumn PhenixAun K Pittman, female   DOB: August 26, 1956, 59 y.o.   MRN: [id number] Placed pt's rle on cpm @ 60 degrees @ [address]  Autumn Pittman 04/10/2015, 8:03 PM

## 2015-04-10 NOTE — Progress Notes (Signed)
Patient ID: Autumn Pittman, female   DOB: 02/04/1956, 59 y.o.   MRN: [id number] PATIENT ID: Autumn Pittman  MRN: [id number]  DOB/AGE:  02/04/1956 / 59 y.o.  1 Day Post-Op Procedure(s) (LRB): RIGHT TOTAL KNEE ARTHROPLASTY (Right)    PROGRESS NOTE Subjective: Patient is alert, oriented, x1 Nausea, no Vomiting, yes passing gas, no Bowel Movement. Taking PO well. Denies SOB, Chest or Calf Pain. Using Incentive Spirometer, PAS in place. Ambulate In room WBAT, CPM 0-40 Patient reports pain as 3 on 0-10 scale  .    Objective: Vital signs in last 24 hours: Filed Vitals:   04/09/15 1141 04/09/15 1938 04/10/15 0048 04/10/15 0613  BP: 171/86 143/74 144/63 112/55  Pulse: 69 78 76 87  Temp: 97.7 F (36.5 C) 97.7 F (36.5 C) 97.8 F (36.6 C) 98.3 F (36.8 C)  TempSrc:  Oral Oral Oral  Resp: 14     Height:      Weight:      SpO2: 100% 100% 100% 100%      Intake/Output from previous day: I/O last 3 completed shifts: In: 2485 [I.V.:2485] Out: -    Intake/Output this shift:     LABORATORY DATA:  Recent Labs  04/10/15 0450  WBC 9.6  HGB 9.9*  HCT 30.5*  PLT 240  NA 139  K 4.0  CL 102  CO2 30  BUN 12  CREATININE 0.84  GLUCOSE 116*  CALCIUM 8.8*    Examination: Neurologically intact ABD soft Neurovascular intact Sensation intact distally Intact pulses distally Dorsiflexion/Plantar flexion intact Incision: dressing C/D/I No cellulitis present Compartment soft}  Assessment:   1 Day Post-Op Procedure(s) (LRB): RIGHT TOTAL KNEE ARTHROPLASTY (Right) ADDITIONAL DIAGNOSIS: Expected Acute Blood Loss Anemia, Hypertension  Plan: PT/OT WBAT, CPM 5/hrs day until ROM 0-90 degrees, then D/C CPM DVT Prophylaxis:  SCDx72hrs, ASA 325 mg BID x 2 weeks DISCHARGE PLAN: Skilled Nursing Facility/Rehab, [facility] DISCHARGE NEEDS: HHPT, CPM, Walker and 3-in-1 comode seat     Moise Friday J 04/10/2015, 7:51 AM

## 2015-04-10 NOTE — Progress Notes (Signed)
Physical Therapy Treatment Patient Details Name: Autumn Pittman MRN: [id number] DOB: Aug 18, 1956 Today's Date: 04/10/2015    History of Present Illness Patient is a 59 y/o female s/p R TKA. PMH of HTN.    PT Comments    Patient showing some frustrations over the use of CPM and nursing not wanting to assist her with placement and usage this AM. RN was made aware and ensure patient that we would assist her into it this afternoon. Patient is motivated and progressing well with ambulation and therex. Continue to recommend SNF for ongoing Physical Therapy as patient will need to regain functional mobility prior to returning home alone during the day.     Follow Up Recommendations  SNF     Equipment Recommendations  None recommended by PT    Recommendations for Other Services       Precautions / Restrictions Precautions Precautions: Knee Precaution Comments: Reviewed HEP and not placing pillow under knee.  Restrictions Weight Bearing Restrictions: Yes RLE Weight Bearing: Weight bearing as tolerated    Mobility  Bed Mobility Overal bed mobility: Needs Assistance       Supine to sit: Min guard;HOB elevated     General bed mobility comments: Use of rail for support. Cues for technique.   Transfers Overall transfer level: Needs assistance Equipment used: Rolling walker (2 wheeled)   Sit to Stand: Min guard         General transfer comment: Cues for safe technique  Ambulation/Gait Ambulation/Gait assistance: Min guard Ambulation Distance (Feet): 150 Feet Assistive device: Rolling walker (2 wheeled) Gait Pattern/deviations: Step-through pattern;Decreased stride length;Trunk flexed   Gait velocity interpretation: Below normal speed for age/gender General Gait Details: Pt with slow, guarded gait. Increased WB through BUEs.    Stairs            Wheelchair Mobility    Modified Rankin (Stroke Patients Only)       Balance                                     Cognition Arousal/Alertness: Awake/alert Behavior During Therapy: WFL for tasks assessed/performed Overall Cognitive Status: Within Functional Limits for tasks assessed                      Exercises Total Joint Exercises Quad Sets: Right;10 reps;AROM Heel Slides: Right;10 reps;AAROM Hip ABduction/ADduction: Right;10 reps;AAROM Straight Leg Raises: Right;10 reps;AAROM Long Arc Quad: Right;10 reps;AAROM    General Comments        Pertinent Vitals/Pain Pain Score: 7  Pain Location: R knee Pain Descriptors / Indicators: Aching;Sore Pain Intervention(s): Monitored during session    Home Living                      Prior Function            PT Goals (current goals can now be found in the care plan section) Progress towards PT goals: Progressing toward goals    Frequency  7X/week    PT Plan      Co-evaluation             End of Session Equipment Utilized During Treatment: Gait belt Activity Tolerance: Patient tolerated treatment well Patient left: in chair;with call bell/phone within reach     Time: 1036-1059 PT Time Calculation (min) (ACUTE ONLY): 23 min  Charges:  $Gait Training: 8-22 mins $Therapeutic Exercise: 8-22  mins                    G Codes:      Fredrich BirksRobinette, Shawnta Zimbelman Elizabeth 04/10/2015, 12:01 PM  04/10/2015 [name], Briana Newman Elizabeth PTA [phone] pager [phone] office

## 2015-04-10 NOTE — Progress Notes (Signed)
OT Cancellation Note  Patient Details Name: Autumn Pittman MRN: [id number] DOB: Jan 22, 1956   Cancelled Treatment:    Reason Eval/Treat Not Completed: Other (comment) Pt's current D/C plan is SNF. No apparent immediate acute care OT needs, therefore will defer OT to SNF. If OT eval is needed or D/C plan changes to home,  please call Acute Rehab Dept. at [phone] or text page OT at [phone].  [facility] Center[facility],HILLARY  [name], OTR/L  [phone] 04/10/2015 04/10/2015, 9:52 AM

## 2015-04-11 ENCOUNTER — Encounter (HOSPITAL_COMMUNITY): Payer: Self-pay | Admitting: Orthopedic Surgery

## 2015-04-11 ENCOUNTER — Inpatient Hospital Stay
Admission: RE | Admit: 2015-04-11 | Discharge: 2015-04-16 | Disposition: A | Discharge status: Home / Self Care | Payer: BLUE CROSS/BLUE SHIELD | Source: Ambulatory Visit | Attending: Internal Medicine | Admitting: Internal Medicine

## 2015-04-11 LAB — CBC
HCT: 27.1 % — ABNORMAL LOW (ref 36.0–46.0)
Hemoglobin: 8.9 g/dL — ABNORMAL LOW (ref 12.0–15.0)
MCH: 31.4 pg (ref 26.0–34.0)
MCHC: 32.8 g/dL (ref 30.0–36.0)
MCV: 95.8 fL (ref 78.0–100.0)
PLATELETS: 203 10*3/uL (ref 150–400)
RBC: 2.83 MIL/uL — ABNORMAL LOW (ref 3.87–5.11)
RDW: 12.8 % (ref 11.5–15.5)
WBC: 9.6 10*3/uL (ref 4.0–10.5)

## 2015-04-11 NOTE — Clinical Social Work Placement (Signed)
   CLINICAL SOCIAL WORK PLACEMENT  NOTE  Date:  04/11/2015  Patient Details  Name: Autumn Pittman MRN: [id number] Date of Birth: 10-23-1956  Clinical Social Work is seeking post-discharge placement for this patient at the Skilled  Nursing Facility level of care (*CSW will initial, date and re-position this form in  chart as items are completed):  Yes   Patient/family provided with [facility] Clinical Social Work Department's list of facilities offering this level of care within the geographic area requested by the patient (or if unable, by the patient's family).  Yes   Patient/family informed of their freedom to choose among providers that offer the needed level of care, that participate in Medicare, Medicaid or managed care program needed by the patient, have an available bed and are willing to accept the patient.  Yes   Patient/family informed of [facility]'s ownership interest in [facility] and [facility], as well as of the fact that they are under no obligation to receive care at these facilities.  PASRR submitted to EDS on 04/10/15     PASRR number received on 04/10/15     Existing PASRR number confirmed on       FL2 transmitted to all facilities in geographic area requested by pt/family on 04/10/15     FL2 transmitted to all facilities within larger geographic area on       Patient informed that his/her managed care company has contracts with or will negotiate with certain facilities, including the following:   Dance movement psychotherapist(Com[employer]BS)     Yes   Patient/family informed of bed offers received.  Patient chooses bed at       Physician recommends and patient chooses bed at      Patient to be transferred to   on  .  Patient to be transferred to facility by       Patient family notified on   of transfer.  Name of family member notified:        PHYSICIAN Please prepare priority discharge summary, including medications, Please sign FL2, Please prepare prescriptions      Additional Comment:    _______________________________________________ Darylene Pricerowder, Maelys Kinnick T, LCSW 04/10/15  6:00 PM

## 2015-04-11 NOTE — Progress Notes (Signed)
Report called to Trellis [facility]agan[facility]Shanika [facility]alls at [facility]. Reviewed hospital course and current POC: wound care, surgical site infections, and follow up needs. Pt's husband to transport patient to SNF. Ms. Lyda KalataSmalls denies needs or questions.

## 2015-04-11 NOTE — Progress Notes (Signed)
PATIENT ID: Autumn Pittman  MRN: [id number]  DOB/AGE:  03/19/1956 / 59 y.o.  2 Days Post-Op Procedure(s) (LRB): RIGHT TOTAL KNEE ARTHROPLASTY (Right)    PROGRESS NOTE Subjective: Patient is alert, oriented, no Nausea, no Vomiting, yes passing gas, no Bowel Movement. Taking PO small bites. Denies SOB, Chest or Calf Pain. Using Incentive Spirometer, PAS in place. Ambulate WBAT, CPM 0-60 Patient reports pain as 7 on 0-10 scale  .    Objective: Vital signs in last 24 hours: Filed Vitals:   04/10/15 0613 04/10/15 1400 04/10/15 1949 04/11/15 0506  BP: 112/55 119/54 151/62 130/68  Pulse: 87 71 89 92  Temp: 98.3 F (36.8 C) 98.7 F (37.1 C) 99.7 F (37.6 C) 99.2 F (37.3 C)  TempSrc: Oral     Resp:  16 16 18   Height:      Weight:      SpO2: 100% 93% 100% 94%      Intake/Output from previous day: I/O last 3 completed shifts: In: 635 [I.V.:635] Out: -    Intake/Output this shift:     LABORATORY DATA:  Recent Labs  04/10/15 0450 04/11/15 0457  WBC 9.6 9.6  HGB 9.9* 8.9*  HCT 30.5* 27.1*  PLT 240 203  NA 139  --   K 4.0  --   CL 102  --   CO2 30  --   BUN 12  --   CREATININE 0.84  --   GLUCOSE 116*  --   CALCIUM 8.8*  --     Examination: Neurologically intact Neurovascular intact Sensation intact distally Intact pulses distally Dorsiflexion/Plantar flexion intact Incision: dressing C/D/I No cellulitis present Compartment soft}  Assessment:   2 Days Post-Op Procedure(s) (LRB): RIGHT TOTAL KNEE ARTHROPLASTY (Right) ADDITIONAL DIAGNOSIS: Expected Acute Blood Loss Anemia, Hypertension  Plan: PT/OT WBAT, CPM 5/hrs day until ROM 0-90 degrees, then D/C CPM DVT Prophylaxis:  SCDx72hrs, ASA 325 mg BID x 2 weeks DISCHARGE PLAN: Skilled Nursing Facility/Rehab DISCHARGE NEEDS: HHPT, HHRN, CPM, Walker and 3-in-1 comode seat     PHILLIPS, ERIC R 04/11/2015, 7:38 AM

## 2015-04-11 NOTE — Discharge Summary (Signed)
Patient ID: Autumn Pittman MRN: [id number] DOB/AGE: [birth year] 59 y.o.  Admit date: 04/09/2015 Discharge date: 04/11/2015  Admission Diagnoses:  Principal Problem:   Primary osteoarthritis of right knee Active Problems:   Arthritis of knee   Discharge Diagnoses:  Same  Past Medical History  Diagnosis Date  . HTN (hypertension)     Surgeries: Procedure(s): RIGHT TOTAL KNEE ARTHROPLASTY on 04/09/2015   Consultants:    Discharged Condition: Improved  Hospital Course: Autumn PhenixDAun K Tipps is an 59 y.o. female who was admitted 04/09/2015 for operative treatment ofPrimary osteoarthritis of right knee. Patient has severe unremitting pain that affects sleep, daily activities, and work/hobbies. After pre-op clearance the patient was taken to the operating room on 04/09/2015 and underwent  Procedure(s): RIGHT TOTAL KNEE ARTHROPLASTY.    Patient was given perioperative antibiotics: Anti-infectives    Start     Dose/Rate Route Frequency Ordered Stop   04/09/15 0833  cefUROXime (ZINACEF) injection  Status:  Discontinued       As needed 04/09/15 0833 04/09/15 0944   04/09/15 0544  ceFAZolin (ANCEF) 2-3 GM-% IVPB SOLR    Comments:  Scronce, Trina   : cabinet override      04/09/15 0544 04/09/15 1759   04/09/15 0542  ceFAZolin (ANCEF) IVPB 2 g/50 mL premix     2 g 100 mL/hr over 30 Minutes Intravenous On call to O.R. 04/09/15 [id number] 04/09/15 0754       Patient was given sequential compression devices, early ambulation, and chemoprophylaxis to prevent DVT.  Patient benefited maximally from hospital stay and there were no complications.    Recent vital signs: Patient Vitals for the past 24 hrs:  BP Temp Pulse Resp SpO2  04/11/15 0506 130/68 mmHg 99.2 F (37.3 C) 92 18 94 %  04/10/15 1949 (!) 151/62 mmHg 99.7 F (37.6 C) 89 16 100 %  04/10/15 1400 (!) 119/54 mmHg 98.7 F (37.1 C) 71 16 93 %     Recent laboratory studies:  Recent Labs  04/10/15 0450 04/11/15 0457  WBC 9.6 9.6  HGB 9.9*  8.9*  HCT 30.5* 27.1*  PLT 240 203  NA 139  --   K 4.0  --   CL 102  --   CO2 30  --   BUN 12  --   CREATININE 0.84  --   GLUCOSE 116*  --   CALCIUM 8.8*  --      Discharge Medications:     Medication List    STOP taking these medications        ibuprofen 200 MG tablet  Commonly known as:  ADVIL,MOTRIN     meloxicam 15 MG tablet  Commonly known as:  MOBIC      TAKE these medications        aspirin EC 325 MG tablet  Take 1 tablet (325 mg total) by mouth 2 (two) times daily.     b complex vitamins tablet  Take 1 tablet by mouth daily.     CALCIUM PO  Take 1 tablet by mouth daily.     cetirizine 10 MG tablet  Commonly known as:  ZYRTEC  Take 10 mg by mouth daily.     cholecalciferol 1000 UNITS tablet  Commonly known as:  VITAMIN D  Take 1,000 Units by mouth daily.     Diclofenac Sodium 2 % Soln  Place 1 application onto the skin 2 (two) times daily.     Glucosamine-Chondroitin 250-200 MG Tabs  Take 1 tablet by  mouth daily.     Guaifenesin 1200 MG Tb12  Take 1,200 mg by mouth daily.     hydrochlorothiazide 25 MG tablet  Commonly known as:  HYDRODIURIL  Take 25 mg by mouth daily.     methocarbamol 500 MG tablet  Commonly known as:  ROBAXIN  Take 1 tablet (500 mg total) by mouth 2 (two) times daily with a meal.     MULTIVITAMIN ADULTS 50+ PO  Take 1 tablet by mouth daily.     oxyCODONE-acetaminophen 5-325 MG per tablet  Commonly known as:  ROXICET  Take 1 tablet by mouth every 4 (four) hours as needed.     VITAMIN E PO  Take 1 tablet by mouth daily.        Diagnostic Studies: Dg Chest 2 View  04/02/2015   CLINICAL DATA:  Hypertension .  EXAM: CHEST  2 VIEW  COMPARISON:  None.  FINDINGS: Mediastinum and hilar structures normal. Lungs are clear. Mild cardiomegaly with normal pulmonary vascularity. No pleural effusion or pneumothorax. No acute bony abnormality.  IMPRESSION: Mild cardiomegaly.  No evidence of CHF.   Electronically Signed   By: Maisie Fushomas   Register   On: 04/02/2015 09:37    Disposition: Final discharge disposition not confirmed      Discharge Instructions    CPM    Complete by:  As directed   Continuous passive motion machine (CPM):      Use the CPM from 0 to 60  for 5 hours per day.      You may increase by 10 degrees per day.  You may break it up into 2 or 3 sessions per day.      Use CPM for 2 weeks or until you are told to stop.     Call MD / Call 911    Complete by:  As directed   If you experience chest pain or shortness of breath, CALL 911 and be transported to the hospital emergency room.  If you develope a fever above 101 F, pus (white drainage) or increased drainage or redness at the wound, or calf pain, call your surgeon's office.     Change dressing    Complete by:  As directed   Change dressing on day 5, then change the dressing daily with sterile 4 x 4 inch gauze dressing and apply TED hose.  You may clean the incision with alcohol prior to redressing.     Constipation Prevention    Complete by:  As directed   Drink plenty of fluids.  Prune juice may be helpful.  You may use a stool softener, such as Colace (over the counter) 100 mg twice a day.  Use MiraLax (over the counter) for constipation as needed.     Diet - low sodium heart healthy    Complete by:  As directed      Discharge instructions    Complete by:  As directed   Follow up in office with Dr. Turner Danielsowan in 2 weeks.     Driving restrictions    Complete by:  As directed   No driving for 2 weeks     Increase activity slowly as tolerated    Complete by:  As directed      Patient may shower    Complete by:  As directed   You may shower without a dressing once there is no drainage.  Do not wash over the wound.  If drainage remains, cover wound with plastic wrap and then  shower.           Follow-up Information    Follow up with Nestor Lewandowsky, MD In 2 weeks.   Specialty:  Orthopedic Surgery   Contact information:   [address] [place]  [address] [phone]        Signed: Vear Clock ERIC R 04/11/2015, 7:40 AM

## 2015-04-11 NOTE — Progress Notes (Signed)
Pt discharging via wheelchair, escorted by staff, accompanied by family (who will then take the [facility] to [facility]). Discharge instructions provided to patient and family, verbalize understanding and able to provide appropriate teach back. PIV discontinued, site without s/s of complication. [facility] provided to pt's husband to give to staff on arrival to SNF. Denies needs or questions at this time.

## 2015-04-11 NOTE — Progress Notes (Signed)
Physical Therapy Treatment Patient Details Name: Autumn PhenixDAun K Pittman MRN: [id number] DOB: 02-05-1956 Today's Date: 04/11/2015    History of Present Illness Patient is a 59 y/o female s/p R TKA. PMH of HTN.    PT Comments    Patient progressing well with overall mobility. She still continues to feel fatigued with ambulation and has to take several resting breaks. Continue to recommend SNF for ongoing Physical Therapy.     Follow Up Recommendations  SNF     Equipment Recommendations  None recommended by PT    Recommendations for Other Services       Precautions / Restrictions Precautions Precautions: Knee Precaution Comments: Reviewed HEP and not placing pillow under knee.  Restrictions RLE Weight Bearing: Weight bearing as tolerated    Mobility  Bed Mobility Overal bed mobility: Needs Assistance Bed Mobility: Supine to Sit;Sit to Supine     Supine to sit: Supervision Sit to supine: Supervision   General bed mobility comments: Patient able to hook LE for support.   Transfers Overall transfer level: Needs assistance Equipment used: Rolling walker (2 wheeled)   Sit to Stand: Supervision         General transfer comment: Cues for safe technique  Ambulation/Gait Ambulation/Gait assistance: Min guard Ambulation Distance (Feet): 180 Feet Assistive device: Rolling walker (2 wheeled) Gait Pattern/deviations: Step-through pattern;Decreased stride length   Gait velocity interpretation: Below normal speed for age/gender General Gait Details: Patient attempting to decrease weight through UEs but buckling noted so continued with heavier UE support on RW.    Stairs            Wheelchair Mobility    Modified Rankin (Stroke Patients Only)       Balance                                    Cognition Arousal/Alertness: Awake/alert Behavior During Therapy: WFL for tasks assessed/performed Overall Cognitive Status: Within Functional Limits for tasks  assessed                      Exercises Total Joint Exercises Quad Sets: Right;AROM;15 reps Heel Slides: Right;AAROM;15 reps Hip ABduction/ADduction: Right;AAROM;15 reps Straight Leg Raises: Right;AAROM;15 reps Long Arc Quad: Right;AAROM;10 reps    General Comments        Pertinent Vitals/Pain Pain Score: 7  Pain Location: R knee with therex Pain Descriptors / Indicators: Aching;Sore Pain Intervention(s): Monitored during session;Repositioned;Premedicated before session    Home Living                      Prior Function            PT Goals (current goals can now be found in the care plan section) Progress towards PT goals: Progressing toward goals    Frequency  7X/week    PT Plan Current plan remains appropriate    Co-evaluation             End of Session Equipment Utilized During Treatment: Gait belt Activity Tolerance: Patient tolerated treatment well Patient left: in chair;with call bell/phone within reach     Time: 0827-0851 PT Time Calculation (min) (ACUTE ONLY): 24 min  Charges:  $Gait Training: 8-22 mins $Therapeutic Exercise: 8-22 mins                    G Codes:      [name], [name]  04/11/2015, 8:58 AM 04/11/2015 [name], [name] PTA [phone] pager [phone] office

## 2015-04-11 NOTE — Care Management Note (Signed)
Case Management Note  Patient Details  Name: Delbert PhenixDAun K Wesby MRN: [id number] Date of Birth: Oct 23, 1956  Subjective/Objective:   59 yr old female admitted with osteoarthritis of her right knee. Patient underwent a right total knee arthroplasty.                 Action/Plan:  [facility]. Social worker has arranged.     Expected Discharge Date: 04/11/15                 Expected Discharge Plan: Skilled Nursing facility     In-House Referral:  Clinical Social Work  Discharge planning Services  CM Consult  Post Acute Care Choice:    Choice offered to:     DME Arranged: NA   DME Agency:     HH Arranged: NA    HH Agency:     Status of Service:  Completed, signed off  Medicare Important Message Given:    Date Medicare IM Given:    Medicare IM give by:    Date Additional Medicare IM Given:    Additional Medicare Important Message give by:     If discussed at Long Length of Stay Meetings, dates discussed:    Additional Comments:  Durenda GuthrieBrady, Dwanna Goshert Naomi, RN 04/11/2015, 11:34 AM

## 2015-04-11 NOTE — Clinical Social Work Note (Signed)
Clinical Social Work Assessment  Patient Details  Name: Autumn Pittman MRN: [id number] Date of Birth: 04-13-1956  Date of referral:  04/10/15               Reason for consult:  Facility Placement                Permission sought to share information with:  Facility Sport and exercise psychologist, Family Supports Permission granted to share information::  Yes, Verbal Permission Granted  Name::     Husband, [place] SNFs         Housing/Transportation Living arrangements for the past 2 months:  [place] of Information:  Patient Patient Interpreter Needed:  None Criminal Activity/Legal Involvement Pertinent to Current Situation/Hospitalization:  No - Comment as needed Significant Relationships:  Spouse, Adult Children Lives with:   Husband Do you feel safe going back to the place where you live?  No (Feels she needs short term rehab- no one at home to help her during day) Need for family participation in patient care:  Yes (Comment)  Care giving concerns: Patient's husband at bedside- states that he can be with her at night but is away during the day. They do not have any family who can stay with her during the day.    Social Worker assessment / plan:  CSW met with patient and her husband this afternoon to discuss SNF options. Patient wants the [facility] and was notified that they cannot make a bed offer for patient as they are not in contract with her insurance company. Other bed offers in [facility] provided- both [place] and [facility] Patient adamantly refuses to consider either facility. Lives in [place]. Agrees to consider [facility]- CSW spoke with Irven Shelling, Admissions for [facility].They accept patient's BCBS but she will have to check benefits and initiate authorization.  Patient denies interest in bed search in [facility].  Fl2 completed and placed on chart for MD's signature.  Anticipate stability for  d/c tomorrow.   Employment status:  Therapist, music:  Managed Care PT Recommendations:  [place] / Referral to community resources:  [place]  Patient/Family's Response to care:  Patient and husband feel strongly that she needs short term rehab and she fully intends to return home and back to work as soon as possible. Patient feels that she will progress well with continued PT and is anxious to resume prior level of independence.    Patient/Family's Understanding of and Emotional Response to Diagnosis, Current Treatment, and Prognosis:   Patient was very upset and concerned that she cannot be placed at the [facility] for her therapy. She had already been in contact with a therapist who works at the [employer] and her daughter works for [facility] [employer].  While disappointed, she stated that she would consider other SNF in [place] for placement.  Patient's husband feels that patient needs short term SNF and he is contacting [employer] re: SNF coverage and benefits.  He feels she will be able to return to independent status very soon.  Emotional Assessment Appearance:  Appears stated age Attitude/Demeanor/Rapport:   (NA) Affect (typically observed):  Calm, Pleasant Orientation:  Oriented to Self, Oriented to Place, Oriented to  Time, Oriented to Situation Alcohol / Substance use:  Not Applicable Psych involvement (Current and /or in the community):  No (Comment)  Discharge Needs  Concerns to be addressed:  Care Coordination Readmission  within the last 30 days:  No Current discharge risk:  Dependent with Mobility Research scientist (medical)- requires auth) Barriers to Discharge:  Whiting, Gissele Narducci T, LCSW 04/10/15  5:50 PM [phone]

## 2015-04-12 ENCOUNTER — Other Ambulatory Visit: Payer: Self-pay | Admitting: *Deleted

## 2015-04-12 ENCOUNTER — Encounter (HOSPITAL_COMMUNITY)
Admission: RE | Admit: 2015-04-12 | Discharge: 2015-04-12 | Disposition: A | Discharge status: Home / Self Care | Payer: BLUE CROSS/BLUE SHIELD | Source: Skilled Nursing Facility | Attending: Internal Medicine | Admitting: Internal Medicine

## 2015-04-12 DIAGNOSIS — I10 Essential (primary) hypertension: Secondary | ICD-10-CM | POA: Insufficient documentation

## 2015-04-12 LAB — BASIC METABOLIC PANEL
Anion gap: 11 (ref 5–15)
BUN: 11 mg/dL (ref 6–20)
CHLORIDE: 96 mmol/L — AB (ref 101–111)
CO2: 33 mmol/L — ABNORMAL HIGH (ref 22–32)
Calcium: 9.1 mg/dL (ref 8.9–10.3)
Creatinine, Ser: 0.68 mg/dL (ref 0.44–1.00)
Glucose, Bld: 101 mg/dL — ABNORMAL HIGH (ref 65–99)
Potassium: 3.3 mmol/L — ABNORMAL LOW (ref 3.5–5.1)
SODIUM: 140 mmol/L (ref 135–145)

## 2015-04-12 LAB — CBC WITH DIFFERENTIAL/PLATELET
BASOS PCT: 0 % (ref 0–1)
Basophils Absolute: 0 10*3/uL (ref 0.0–0.1)
EOS ABS: 0.2 10*3/uL (ref 0.0–0.7)
Eosinophils Relative: 2 % (ref 0–5)
HCT: 28.4 % — ABNORMAL LOW (ref 36.0–46.0)
Hemoglobin: 9.3 g/dL — ABNORMAL LOW (ref 12.0–15.0)
Lymphocytes Relative: 24 % (ref 12–46)
Lymphs Abs: 2.3 10*3/uL (ref 0.7–4.0)
MCH: 31.6 pg (ref 26.0–34.0)
MCHC: 32.7 g/dL (ref 30.0–36.0)
MCV: 96.6 fL (ref 78.0–100.0)
Monocytes Absolute: 1.2 10*3/uL — ABNORMAL HIGH (ref 0.1–1.0)
Monocytes Relative: 13 % — ABNORMAL HIGH (ref 3–12)
Neutro Abs: 5.6 10*3/uL (ref 1.7–7.7)
Neutrophils Relative %: 61 % (ref 43–77)
Platelets: 246 10*3/uL (ref 150–400)
RBC: 2.94 MIL/uL — AB (ref 3.87–5.11)
RDW: 12.6 % (ref 11.5–15.5)
WBC: 9.3 10*3/uL (ref 4.0–10.5)

## 2015-04-12 MED ORDER — OXYCODONE-ACETAMINOPHEN 5-325 MG PO TABS: ORAL_TABLET | ORAL | Status: AC

## 2015-04-12 NOTE — Telephone Encounter (Signed)
[facility] 

## 2015-04-13 ENCOUNTER — Encounter: Payer: Self-pay | Admitting: Internal Medicine

## 2015-04-13 ENCOUNTER — Non-Acute Institutional Stay (SKILLED_NURSING_FACILITY): Payer: BLUE CROSS/BLUE SHIELD | Admitting: Internal Medicine

## 2015-04-13 DIAGNOSIS — E876 Hypokalemia: Secondary | ICD-10-CM | POA: Insufficient documentation

## 2015-04-13 DIAGNOSIS — M1711 Unilateral primary osteoarthritis, right knee: Secondary | ICD-10-CM | POA: Diagnosis not present

## 2015-04-13 DIAGNOSIS — I10 Essential (primary) hypertension: Secondary | ICD-10-CM | POA: Diagnosis not present

## 2015-04-13 DIAGNOSIS — D62 Acute posthemorrhagic anemia: Secondary | ICD-10-CM

## 2015-04-13 NOTE — Progress Notes (Signed)
Patient ID: Autumn Pittman, female   DOB: Apr 14, 1956, 59 y.o.   MRN: [id number]   This is an acute visit.  Level care skilled.  Facility [facility].  Chief complaint-acute visit status post hospitalization for right knee replacement secondary to end-stage osteoarthritis.  History of present illness.  Patient is a very pleasant 59 year old female was admitted on May 16 for operative treatment of primary osteoarthritis of the right knee-this is secondary to unremitting pain.  She apparently tolerated the procedure without difficulty-and is here for short-term rehabilitation.  Appears her hemoglobin did drop to 8.9 but did not require transfusion.  Currently patient has no acute complaints she appears to be doing very well her rehabilitation-pain appears to be controlled on oxycodone.  Previous medical history.  History of right knee replacement status post end-stage osteoarthritis.  Hypertension.  Apparently some history of allergic rhinitis.  Surgical history-.  History of varicose vein removal on the right leg.  Also history of bilateral Lasix eye surgery.  Social history patient is a never smoker apparently social alcohol use  Family history nonpertinent.  Medications.  Aspirin enteric-coated 325 mg twice a day.  B complex vitamin daily.  Calcium daily.  Zyrtec 10 mg daily.  Vitamin D 1000 units daily.  Glucosamine 1 tablet daily.  Guaifenesin 1200 mg daily.  Hydrochlorothiazide 25 mg daily.  Robaxin 500 mg twice a day.  Multivitamin daily.  Roxicet 5-3 25 mg 1 tablet every 4 hours when necessary pain.  Vitamin E daily.  Review of systems.  General no complaints of fever or chills.  Skin does not complain of rashes or itching surgical site right hip appears to be stable.  Head ears eyes nose mouth and throat no complaints of visual changes or sore throat.  Eyes does not complain of visual changes or pain.  Her story note shortness breath or  cough.  Cardiac no chest pain no significant lower extremity edema except edema postop around right knee.  GI does not complain of nausea vomiting diarrhea constipation or abdominal discomfort.  GU does not complain of dysuria.  Musculoskeletal does have some history of right knee discomfort but this is controlled with Percocet.  Neurologic does not complain of dizziness headache or numbness.  Psych does not complain of depression or anxiety  .  Physical exam.  Temperature is 97.9 pulse 92 respirations 20 blood pressure 134/75.  General this is a very pleasant middle-aged female in no distress resting comfortably in bed.  Her skin is warm and dry.  Surgical site right knee there is some mild postop erythema this does not appear to be a cellulitis-there is some edema slight warmth appears baseline as well-surgical scar appears to be healing unremarkably  Eyes pupils appear reactive light sclerae and conjunctivae clear visual acuity appears to be intact.  Oropharynx clear mucous membranes moist.  Chest is clear to auscultation there is no labored breathing.  Heart is regular rate and rhythm without murmur gallop or rub-she does not really have significant lower extremity edema except postop edema around the right knee-pedal pulses are intact bilaterally capillary refill intact bilaterally.  Abdomen is soft nontender positive bowel sounds.  Muscle skeletal moves all extremities at baseline again limited on the right lower external be secondary surgery I do not see any deformities strength appears to be intact bilateral upper extremities.  Neurologic is grossly intact no lateralizing findings her speech is clear touch sensation appears to be intact.  Psych she is alert and oriented pleasant  and appropriate.  Labs.  04/12/2015.  Sodium 140 potassium 3.3 BUN 11 creatinine 0.68 CO2 33.  WBC 9.3 hemoglobin 9.3 platelets 246.  Assessment and plan.  #1-history right knee  replacement secondary to end-stage osteoarthritis-this appears to be quite stable she is on aspirin twice a day for anticoagulation-she is getting Roxicet for pain this appears to be stable also continues on calcium as well as glucosamine.  Also has a muscle relaxer when necessary  She will be working with therapy I suspect her stay will be quite short.  #2-history of hypertension-this appears to be stable recent blood pressures 134/70 112/52   she is on hydrochlorothiazide I do note mild hypokalemia we are supplementing this she will need a CBC BMP on Monday, May 23.  #3-anemia most likely postop hemoglobin appears to be rising again we will update this next week hemoglobin yesterday was 9.3 which is an improvement from discharge lab of 8.9.  #4 history of allergic rhinitis apparently she is on Zyrtec this appears to be stable.  [id number]-of note greater than 35 minutes spent assessing patient-reviewing her hospital records and medical history-and coordinating formulating a plan of care for numerous diagnoses-of note greater than 50% of time spent coordinating plan of care

## 2015-04-16 ENCOUNTER — Non-Acute Institutional Stay (SKILLED_NURSING_FACILITY): Payer: BLUE CROSS/BLUE SHIELD | Admitting: Internal Medicine

## 2015-04-16 ENCOUNTER — Encounter (HOSPITAL_COMMUNITY)
Admission: RE | Admit: 2015-04-16 | Discharge: 2015-04-16 | Disposition: A | Discharge status: Home / Self Care | Payer: BLUE CROSS/BLUE SHIELD | Source: Skilled Nursing Facility | Attending: Internal Medicine | Admitting: Internal Medicine

## 2015-04-16 DIAGNOSIS — I10 Essential (primary) hypertension: Secondary | ICD-10-CM | POA: Diagnosis not present

## 2015-04-16 DIAGNOSIS — M1711 Unilateral primary osteoarthritis, right knee: Secondary | ICD-10-CM | POA: Diagnosis not present

## 2015-04-16 DIAGNOSIS — E876 Hypokalemia: Secondary | ICD-10-CM | POA: Diagnosis not present

## 2015-04-16 LAB — CBC
HEMATOCRIT: 29.5 % — AB (ref 36.0–46.0)
HEMOGLOBIN: 9.5 g/dL — AB (ref 12.0–15.0)
MCH: 31.4 pg (ref 26.0–34.0)
MCHC: 32.2 g/dL (ref 30.0–36.0)
MCV: 97.4 fL (ref 78.0–100.0)
Platelets: 412 10*3/uL — ABNORMAL HIGH (ref 150–400)
RBC: 3.03 MIL/uL — AB (ref 3.87–5.11)
RDW: 12.9 % (ref 11.5–15.5)
WBC: 8 10*3/uL (ref 4.0–10.5)

## 2015-04-16 LAB — BASIC METABOLIC PANEL
ANION GAP: 10 (ref 5–15)
BUN: 16 mg/dL (ref 6–20)
CO2: 33 mmol/L — ABNORMAL HIGH (ref 22–32)
Calcium: 9.4 mg/dL (ref 8.9–10.3)
Chloride: 97 mmol/L — ABNORMAL LOW (ref 101–111)
Creatinine, Ser: 0.79 mg/dL (ref 0.44–1.00)
GFR calc Af Amer: >60 mL/min (ref >60)
Glucose, Bld: 102 mg/dL — ABNORMAL HIGH (ref 65–99)
Potassium: 3.4 mmol/L — ABNORMAL LOW (ref 3.5–5.1)
SODIUM: 140 mmol/L (ref 135–145)

## 2015-04-16 NOTE — Progress Notes (Signed)
Patient ID: Autumn Pittman, female   DOB: [birth year], 59 y.o.   MRN: [id number]  Facility; [facility] Chief complaint; admission to SNF post admit to [facility] from 5/16 to 5/18  History; this is a reasonably healthy 59 year old woman who underwent a elective right total knee replacement. She had tolerated the surgery well. Wanted some aggressive physical therapy though she is here for that for 7 days. Does not appear to have been any postoperative issues. The only medication she was taking at home was hydrochlorothiazide for hypertension allergy symptoms.  CBC Latest Ref Rng 04/16/2015 04/12/2015 04/11/2015  WBC 4.0 - 10.5 K/uL 8.0 9.3 9.6  Hemoglobin 12.0 - 15.0 g/dL 7.8(G) 9.5(A) 8.9(L)  Hematocrit 36.0 - 46.0 % 29.5(L) 28.4(L) 27.1(L)  Platelets 150 - 400 K/uL 412(H) 246 203   Lab Results  Component Value Date   CREATININE 0.79 04/16/2015   BUN 16 04/16/2015   NA 140 04/16/2015   K 3.4* 04/16/2015   CL 97* 04/16/2015   CO2 33* 04/16/2015     Past Medical History  Diagnosis Date  . HTN (hypertension)    Past Surgical History  Procedure Laterality Date  . Varicose vein removal      rt leg   . Eye surgery Bilateral 02/2008    lasik   . Total knee arthroplasty Right 04/09/2015    Procedure: RIGHT TOTAL KNEE ARTHROPLASTY;  Surgeon: Gean Birchwood, MD;  Location: [facility] OR;  Service: Orthopedics;  Laterality: Right;   .  Current Outpatient Prescriptions on File Prior to Visit  Medication Sig Dispense Refill  . aspirin EC 325 MG tablet Take 1 tablet (325 mg total) by mouth 2 (two) times daily. 30 tablet 0  . b complex vitamins tablet Take 1 tablet by mouth daily.    Marland Kitchen CALCIUM PO Take 1 tablet by mouth daily.    . cetirizine (ZYRTEC) 10 MG tablet Take 10 mg by mouth daily.    . cholecalciferol (VITAMIN D) 1000 UNITS tablet Take 1,000 Units by mouth daily.    . Diclofenac Sodium 2 % SOLN Place 1 application onto the skin 2 (two) times daily.    . Glucosamine-Chondroitin 250-200 MG TABS Take  1 tablet by mouth daily.    . Guaifenesin 1200 MG TB12 Take 1,200 mg by mouth daily.    . hydrochlorothiazide (HYDRODIURIL) 25 MG tablet Take 25 mg by mouth daily.    . methocarbamol (ROBAXIN) 500 MG tablet Take 1 tablet (500 mg total) by mouth 2 (two) times daily with a meal. 60 tablet 0  . Multiple Vitamins-Minerals (MULTIVITAMIN ADULTS 50+ PO) Take 1 tablet by mouth daily.    Marland Kitchen oxyCODONE-acetaminophen (ROXICET) 5-325 MG per tablet Take one tablet by mouth every 4 hours as needed for pain. Max APAP 3gm/24 hours from all sources 180 tablet 0  . VITAMIN E PO Take 1 tablet by mouth daily.      Socially; the patient lives in her own home with her husband. She is independent with ADLs and IDL's still working. According to what I been able to understand she has [employer] blue shield which we do not take here, however she really wanted to come for rehabilitation here so she is on a 7 day LOG  reports that she has never smoked. She has never used smokeless tobacco. She reports that she drinks alcohol. She reports that she does not use illicit drugs.  Review of systems Respiratory no cough no shortness of breath Cardiac no chest pain  GI states bowel movements are back to normal GU no dysuria  Physical exam Gen. patient looks really well Respiratory clear entry bilaterally post elective right total knee replacement. She really as tolerated this well. She is on twice a day aspirin for DVT prophylaxis. Cardiac heart sounds are normal no murmurs Abdomen soft no masses no tenderness GU no bladder distention Extremities her right knee incision looks really very healthy. There is no evidence of a DVT bilaterally.  Impression/plan #1 post right total knee replacement. She is tolerated this well. Her incision looks very stable. She is on twice a day adult strength aspirin for DVT prophylaxis which should finish in 2 weeks #2 hypokalemia likely secondary to thiazides plus or minus free fluids #3  postoperative anemia. The this seems stable now up to 9.5. It was 8.9 when she left the hospital..  I see no major issues here. The patient should be stable for discharge and 7 days.

## 2015-04-26 ENCOUNTER — Ambulatory Visit (HOSPITAL_COMMUNITY): Payer: BLUE CROSS/BLUE SHIELD | Attending: Orthopedic Surgery

## 2015-04-26 DIAGNOSIS — Z96651 Presence of right artificial knee joint: Secondary | ICD-10-CM | POA: Diagnosis not present

## 2015-04-26 DIAGNOSIS — M25661 Stiffness of right knee, not elsewhere classified: Secondary | ICD-10-CM | POA: Insufficient documentation

## 2015-04-26 DIAGNOSIS — R531 Weakness: Secondary | ICD-10-CM | POA: Diagnosis not present

## 2015-04-26 DIAGNOSIS — M25361 Other instability, right knee: Secondary | ICD-10-CM | POA: Diagnosis not present

## 2015-04-26 DIAGNOSIS — Z471 Aftercare following joint replacement surgery: Secondary | ICD-10-CM | POA: Diagnosis not present

## 2015-04-26 NOTE — Patient Instructions (Addendum)
Straight Leg Raise     Tighten stomach and slowly raise locked right leg _12__ inches from floor. Repeat _10___ times per set. Do __3__ sets per session. Do __2__ sessions per day.  http://orth.exer.us/1103   Copyright  VHI. All rights reserved.  KNEE: Extension, Short Arc Quads (Weight)   Place weight around leg. Lie on back with bolster under knees. Straighten knee. Hold _1__ seconds. Use __0_ lb weight. __8_ reps per set, __3_ sets per session, _2__ times per day   Copyright  VHI. All rights reserved.   High Stepping in Place (Standing)   Stand with support and feet together. Lift R knee until perpendicular to floor. Keep torso erect. Repeat 10 times per set, 3 sets per session, and 2 sessions per day. Right leg only. Copyright  VHI. All rights reserved.   Seated Knee flexion Stretch   Knee - Flexion Stretching  Sit at the edge of the chair and slowly pull the surgical foot back to bend the knee. Shift hips forward in chair to increase stretch on knee.  Pause and hold 30 seconds,relax and repeat.  Perform 5x per set, 1 set per session, and 2 sessions per day.

## 2015-04-27 ENCOUNTER — Ambulatory Visit (HOSPITAL_COMMUNITY): Payer: BLUE CROSS/BLUE SHIELD

## 2015-04-27 DIAGNOSIS — Z96651 Presence of right artificial knee joint: Secondary | ICD-10-CM

## 2015-04-27 DIAGNOSIS — R531 Weakness: Secondary | ICD-10-CM

## 2015-04-27 DIAGNOSIS — M25661 Stiffness of right knee, not elsewhere classified: Secondary | ICD-10-CM

## 2015-04-27 DIAGNOSIS — Z471 Aftercare following joint replacement surgery: Secondary | ICD-10-CM | POA: Diagnosis not present

## 2015-04-27 DIAGNOSIS — M25361 Other instability, right knee: Secondary | ICD-10-CM

## 2015-04-27 NOTE — Therapy (Signed)
[facility] [address] [place], [place], [address] Phone: [phone]   Fax:  [phone]  Physical Therapy Treatment  Patient Details  Name: RHYLIN VENTERS MRN: [id number] Date of Birth: Apr 04, 1956 Referring Provider:  Gean Birchwood, MD  Encounter Date: 04/27/2015      PT End of Session - 04/27/15 1317    Visit Number 2   Number of Visits 18   Date for PT Re-Evaluation 05/24/15   Authorization Type [employer]    Authorization Time Period 04/24/2015-06/24/2015   Authorization - Visit Number 2   Authorization - Number of Visits 18   PT Start Time 1303   PT Stop Time 1350   PT Time Calculation (min) 47 min   Activity Tolerance Patient tolerated treatment well   Behavior During Therapy [facility] for tasks assessed/performed      Past Medical History  Diagnosis Date  . HTN (hypertension)     Past Surgical History  Procedure Laterality Date  . Varicose vein removal      rt leg   . Eye surgery Bilateral 02/2008    lasik   . Total knee arthroplasty Right 04/09/2015    Procedure: RIGHT TOTAL KNEE ARTHROPLASTY;  Surgeon: Gean Birchwood, MD;  Location: [facility] OR;  Service: Orthopedics;  Laterality: Right;    There were no vitals filed for this visit.  Visit Diagnosis:  Presence of right artificial knee joint  Right knee gives way  Stiffness of knee joint, right  Increased weakness when walking      Subjective Assessment - 04/27/15 1312    Subjective Pt stated pain scale Rt knee 4/10, stated compliance with HEP without question.   Currently in Pain? Yes   Pain Score 4    Pain Location Knee   Pain Orientation Right            [facility] PT Assessment - 04/27/15 0001    Assessment   Medical Diagnosis R TKA   Onset Date/Surgical Date 04/09/15   Next MD Visit 05/22/15   Prior Therapy Attempted, unsuccessful.             [facility] Adult PT Treatment/Exercise - 04/27/15 0001    Exercises   Exercises Knee/Hip   Knee/Hip Exercises:  Stretches   Active Hamstring Stretch 3 reps;30 seconds   Active Hamstring Stretch Limitations standing 12in step   Quad Stretch 3 reps;30 seconds   Quad Stretch Limitations prone with rope   Knee: Self-Stretch Limitations 10x on 12 in step   Gastroc Stretch 3 reps;30 seconds   Gastroc Stretch Limitations slant board   Knee/Hip Exercises: Aerobic   Stationary Bike 8' seat 8 for ROM   Knee/Hip Exercises: Standing   Functional Squat 10 reps   Functional Squat Limitations cueing for approriate weight loading   Knee/Hip Exercises: Supine   Quad Sets Right;10 reps   Short Arc Quad Sets Right;15 reps   Heel Slides Right;10 reps   Bridges Both;10 reps   Straight Leg Raises Right;10 reps   Knee/Hip Exercises: Prone   Hamstring Curl 10 reps   Hip Extension 10 reps                PT Education - 04/26/15 1446    Education provided Yes   Education Details Gave education on scar massage: circles, approximation, and perpendicular tensioning.   Person(s) Educated Patient   Methods Explanation;Demonstration;Tactile cues   Comprehension Verbalized understanding;Returned demonstration          PT  Short Term Goals - 04/27/15 1400    PT SHORT TERM GOAL #1   Title Pt will demonstrate independence in HEP by performing at least 1x daily without assistance.    Baseline advanced HEP given today   Status On-going   PT SHORT TERM GOAL #2   Title Pt will demonstrate improved R knee ROM as evdienced by PROM of 105 degrees, in order to improve ability to exit/enter vehicle.    Baseline 04/28/2015 115 degrees of knee flexion   Status Achieved   PT SHORT TERM GOAL #3   Title Pt will increase active walking tolerance to 2 hours to demonstrate great activity tolerance during IADL.    Status On-going           PT Long Term Goals - 04/27/15 1401    PT LONG TERM GOAL #1   Title Pt will demonstrate improved R knee ROM as evdienced by PROM of 120 degrees, in order to improve ability to  exit/enter vehicle and climb stairs.     Status On-going   PT LONG TERM GOAL #2   Title Pt will increase active walking tolerance to 4 hours to demonstrate greater activity tolerance in order to return to work and/or PLOF of leisure activities.    PT LONG TERM GOAL #3   Title Pt will ambulated 107400ft independently at 1.623m/s or greater to demonstrate ability to perform safe community ambulation.                Plan - 04/27/15 1318    Clinical Impression Statement Reviewed goals, compliance and assurance with HEP technique and copy of initial evaluation given.  Pt able to verbalize and demonstrate appropriate technique with HEP.  Session focus on improving AROM with stretches, ROM exercises and functional strengthening.  Noted vast improvements with AROM 2-115 degrees following stretches and ROM exercises.  Pt able to demonstrate appropriate technique with all exercises with minimal cueing for form initially with exercises.  Pt given advanced HEP printout for stretchres and LE strengthening for weak musculature found initial eval.  Pt stated pain reduced at end of session.  Encouraged pt to apply ice for pain and edema control.   PT Next Visit Plan Make sure pt comfortable with new HEP.  Progress to standing exercises to improve AROM, strengthening and balance.  Continue with stretches, continue functinal strengthening with lunges and progress balance.     PT Home Exercise Plan Advanced HEP given for ROM and strengthening        Problem List Patient Active Problem List   Diagnosis Date Noted  . Hypokalemia 04/13/2015  . HTN (hypertension) 04/13/2015  . Arthritis of knee 04/09/2015  . Bilateral knee pain 03/31/2013  . Pes anserine bursitis 02/08/2013  . Patella, chondromalacia 01/04/2013  . OA (osteoarthritis) of knee 04/22/2011  . Primary osteoarthritis of right knee 09/23/2010  . DERANGEMENT MENISCUS 09/23/2010   Juel Burrowasey Jo Cockerham, PTA  Juel BurrowCockerham, Casey Jo 04/27/2015, 2:09  PM  [facility] [address] [place], [place], [address] Phone: [phone]   Fax:  [phone]

## 2015-04-27 NOTE — Patient Instructions (Addendum)
Chair Knee Flexion   Keeping feet on floor, slide foot of operated leg back, bending knee. Hold 30 seconds. Repeat 10-15 times. Do 1-2 sessions a day.  http://gt2.exer.us/305   Copyright  VHI. All rights reserved.   Knee / Mills KollerQuad   Lie on stomach, knees together. Grab one ankle with same side hand. Use towel if needed to reach. Gently pull foot toward buttock. Hold 30  seconds. Repeat with other leg. Repeat 3 times. Do 1-2 sessions per day.  Copyright  VHI. All rights reserved.   Hamstring Stretch (Standing)   Standing, place one heel on chair or bench. Use one or both hands on thigh for support. Keeping torso straight, lean forward slowly until a stretch is felt in back of same thigh. Hold 30 seconds. Repeat with other leg.  Copyright  VHI. All rights reserved.   HIP: Flexion / KNEE: Extension, Straight Leg Raise   Raise leg, keeping knee straight. Perform slowly. 10-20 reps per set, 1-2 sets per day, 3-5 days per week   Copyright  VHI. All rights reserved.   Straight Leg Raise (Prone)   Abdomen and head supported, keep left knee locked and raise leg at hip. Avoid arching low back. Repeat 10-20 times per set. Do 1-2 sets per session. Do 3-5 sessions per day.  http://orth.exer.us/1113   Copyright  VHI. All rights reserved.   HIP: Extension / KNEE: Flexion - Prone   Bend knee, squeeze glutes. Raise leg up. 10-20 reps per set, 1-2 sets per day, 3-5days per week   Copyright  VHI. All rights reserved.

## 2015-04-27 NOTE — Therapy (Signed)
[place], [place], [address] Phone: [phone]   Fax:  [phone]  Physical Therapy Evaluation  Patient Details  Name: Autumn Pittman MRN: [id number] Date of Birth: 10/14/1956 Referring Provider:  Frederik Pear, MD  Encounter Date: 04/26/2015   04/26/15 1453  PT Visits / Re-Eval  Visit Number 1  Number of Visits 18  Date for PT Re-Evaluation 05/24/15  Authorization  Authorization Type BCBS Anthem   Authorization Time Period 04/24/2015-06/24/2015  Authorization - Visit Number 1  Authorization - Number of Visits 29 (Authorized for 60 visits, 5 have been used in STR. )  PT Time Calculation  PT Start Time 1308  PT Stop Time 1420  PT Time Calculation (min) 72 min  PT - End of Session  Activity Tolerance Patient tolerated treatment well;No increased pain  Behavior During Therapy [facility] for tasks assessed/performed    Past Medical History  Diagnosis Date  . HTN (hypertension)     Past Surgical History  Procedure Laterality Date  . Varicose vein removal      rt leg   . Eye surgery Bilateral 02/2008    lasik   . Total knee arthroplasty Right 04/09/2015    Procedure: RIGHT TOTAL KNEE ARTHROPLASTY;  Surgeon: Frederik Pear, MD;  Location: [place];  Service: Orthopedics;  Laterality: Right;    There were no vitals filed for this visit.  Visit Diagnosis:  Presence of right artificial knee joint  Stiffness of knee joint, right  Right knee gives way  Increased weakness when walking      Subjective Assessment - 04/26/15 1413    Subjective Pt is a 59yo white female who presents 14 days s/p R TKA. Pt DC from [facility] to [facility] wjere she stayed for 5 days and later DC after all goals were met. Pt lives at home with help from daughter who is an OT. Pt reports a rich history of various surgeries and injections to both knees, ultimately resultant in the aforementioned TKA. At eval patient denies c/o pain, reporting only a mild  dull ache. 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    Pertinent History History of multiple scopes and injections between both knees over last 15 years, including a L distal tibial fracture sustained during inline skating.    Limitations Standing;Walking;Other (comment)   Patient Stated Goals Return to prior level of funciton, including regular exercise, R knee ROM, prolonged walking, and hiking on uneven surfaces.   Currently in Pain? No/denies   Pain Location Knee   Pain Orientation Right   Pain Descriptors / Indicators Dull;Aching   Pain Type Surgical pain   Pain Frequency Constant                 [facility] PT Assessment - 04/26/15 1220    Assessment   Medical Diagnosis R TKA   Onset Date/Surgical Date 04/09/15   Next MD Visit 05/22/15   Prior Therapy Attempted, unsuccessful.   Restrictions   Weight Bearing Restrictions No   Home Environment   Living Environment Private residence   Living Arrangements Spouse/significant other;Children   Available Help at [place] [facility] - 2 wheels;[name] - single point   Prior Function   Level of Independence Independent   Vocation Full time employment   Leisure [place], walking   Cognition   Overall Cognitive Status Within Functional Limits for tasks assessed   Attention Focused;Sustained  Observation/Other Assessments   Skin Integrity Scar healing WNL for timeframe; MD recently cleared for full water exposure.   Mild blanching erythema.    PROM   Overall PROM  Deficits   Overall PROM Comments HS length is equal bilat, ~78 degrees   Right Hip External Rotation  45   Right Hip Internal Rotation  25   Left Hip Extension 30  R: 25 degrees   Left Hip Flexion 150  R: 155 degrees   Left Hip External Rotation  60   Left Hip Internal Rotation  20   Left Knee Extension -6  R: 3 degrees   Left Knee Flexion 138  R: 95 degrees   Left Ankle Dorsiflexion 18  R: 10 degrees   Left Ankle  Plantar Flexion 55  R: 45 degrees   Strength   Right Hip Flexion 3+/5   Right Hip Extension 4+/5   Right Hip External Rotation  3  limited by pain   Right Hip Internal Rotation  3  limited by pain   Right Hip ADduction 5/5   Left Hip Flexion 5/5   Left Hip Extension 4/5   Left Hip External Rotation  5   Left Hip Internal Rotation  5   Left Hip ADduction 5/5   Right Knee Flexion 5/5   Right Knee Extension 4-/5   Left Knee Flexion 5/5   Left Knee Extension 5/5   Right Ankle Dorsiflexion 5/5   Left Ankle Dorsiflexion 5/5   Palpation   Patella mobility good med/lat mobility; moderately limited inf/sup   Bed Mobility   Bed Mobility --  All performed independently   Transfers   Transfers Sit to Stand   Sit to Stand 7: Independent   Ambulation/Gait   Ambulation/Gait Yes   Ambulation/Gait Assistance 6: Modified independent (Device/Increase time)   Ambulation Distance (Feet) 100 Feet   Assistive device Straight cane   Gait Pattern Within Functional Limits   Functional Gait  Assessment   Gait assessed  Yes   Gait Level Surface --  presenting with good symmetry in mechanics, cadence, and wei                        PT Education - 04/26/15 1446    Education provided Yes   Education Details Gave education on scar massage: circles, approximation, and perpendicular tensioning.   Person(s) Educated Patient   Methods Explanation;Demonstration;Tactile cues   Comprehension Verbalized understanding;Returned demonstration          PT Short Term Goals - 04/26/15 1502    PT SHORT TERM GOAL #1   Title Pt will demonstrate independence in HEP by performing at least 1x daily without assistance.    Baseline Assigned program at evaluation.    Time 4   Period Weeks   Status New   PT SHORT TERM GOAL #2   Title Pt will demonstrate improved R knee ROM as evdienced by PROM of 105 degrees, in order to improve ability to exit/enter vehicle.    Baseline 95 degrees R knee  flexion    Time 4   Period Weeks   Status New   PT SHORT TERM GOAL #3   Title Pt will increase active walking tolerance to 2 hours to demonstrate great activity tolerance during IADL.    Baseline 45 minutes   Time 3   Period Weeks   Status New           PT Long Term Goals -  04/26/15 1507    PT LONG TERM GOAL #1   Title Pt will demonstrate improved R knee ROM as evdienced by PROM of 120 degrees, in order to improve ability to exit/enter vehicle and climb stairs.     Baseline 95 degrees flexion.    Time 8   Period Weeks   Status New   PT LONG TERM GOAL #2   Title Pt will increase active walking tolerance to 4 hours to demonstrate greater activity tolerance in order to return to work and/or PLOF of leisure activities.    Baseline 90 minutes   Time 8   Period Weeks   Status New   PT LONG TERM GOAL #3   Title Pt will ambulated 1048f independently at 1.223m or greater to demonstrate ability to perform safe community ambulation.    Baseline Gait velocity below norms for age/gender.    Time 8   Period Weeks   Status New               Plan - 04/26/15 1456    Clinical Impression Statement Pt has been active and motivated  in her HEP since her DC from [facility]. Pt presenting with Limited ROM in right knee, weakness in R hip flexors and L hip extensors, and hypertonicity in the R rectus femoris. Pt reports she is still most limited by weakness adnd instability but is able to perform limited community ambulation with SPC x 1week. Pt will benefit from skilled intervention to address the follow inmpaiments in order to restore to PLOF.    Pt will benefit from skilled therapeutic intervention in order to improve on the following deficits Decreased range of motion;Difficulty walking;Impaired tone;Obesity;Decreased activity tolerance;Decreased skin integrity;Pain;Decreased balance;Increased edema;Decreased strength;Decreased mobility   Rehab Potential Good   PT Frequency 3x / week   PT  Duration 6 weeks   PT Treatment/Interventions ADLs/Self Care Home Management;Therapeutic exercise;Therapeutic activities;Functional mobility training;Stair training;Gait training;DME Instruction;Balance training;Manual techniques;Passive range of motion;Scar mobilization   PT Next Visit Plan Begin to progress strengthening and balance development. Improve ROM into knee flexion.    PT Home Exercise Plan Give at Eval, please review seated stretch as first copy did not include picture.    Consulted and Agree with Plan of Care Patient         Problem List Patient Active Problem List   Diagnosis Date Noted  . Hypokalemia 04/13/2015  . HTN (hypertension) 04/13/2015  . Arthritis of knee 04/09/2015  . Bilateral knee pain 03/31/2013  . Pes anserine bursitis 02/08/2013  . Patella, chondromalacia 01/04/2013  . OA (osteoarthritis) of knee 04/22/2011  . Primary osteoarthritis of right knee 09/23/2010  . DERANGEMENT MENISCUS 09/23/2010    Buccola,Allan C 04/27/2015, 11:54 AM 11:54 AM  AlEtta GrandchildPT, DPT [place] License # [id number]    [facility] [address]one: [phone] Fax:  [phone]

## 2015-05-02 ENCOUNTER — Ambulatory Visit (HOSPITAL_COMMUNITY): Payer: BLUE CROSS/BLUE SHIELD | Admitting: Physical Therapy

## 2015-05-02 DIAGNOSIS — M25661 Stiffness of right knee, not elsewhere classified: Secondary | ICD-10-CM

## 2015-05-02 DIAGNOSIS — R531 Weakness: Secondary | ICD-10-CM

## 2015-05-02 DIAGNOSIS — M25361 Other instability, right knee: Secondary | ICD-10-CM

## 2015-05-02 DIAGNOSIS — Z96651 Presence of right artificial knee joint: Secondary | ICD-10-CM

## 2015-05-02 DIAGNOSIS — Z471 Aftercare following joint replacement surgery: Secondary | ICD-10-CM | POA: Diagnosis not present

## 2015-05-02 NOTE — Therapy (Signed)
[facility] [facility] [address] [place], [place], [address] Phone: [phone]   Fax:  [phone]  Physical Therapy Treatment  Patient Details  Name: Autumn Pittman MRN: [id number] Date of Birth: [birth year] Referring Provider:  Kari BaarsHawkins, Edward, MD  Encounter Date: 05/02/2015      PT End of Session - 05/02/15 1708    Visit Number 2   Number of Visits 18   Date for PT Re-Evaluation 05/24/15   Authorization Type BCBS Anthem    Authorization Time Period 04/24/2015-06/24/2015   Authorization - Visit Number 2   Authorization - Number of Visits 18   PT Start Time 1600   PT Stop Time 1645   PT Time Calculation (min) 45 min   Activity Tolerance Patient tolerated treatment well   Behavior [facility] for tasks assessed/performed      Past Medical History  Diagnosis Date  . HTN (hypertension)     Past Surgical History  Procedure Laterality Date  . Varicose vein removal      rt leg   . Eye surgery Bilateral 02/2008    lasik   . Total knee arthroplasty Right 04/09/2015    Procedure: RIGHT TOTAL KNEE ARTHROPLASTY;  Surgeon: Gean BirchwoodFrank Rowan, MD;  Location: [facility];  Service: Orthopedics;  Laterality: Right;    There were no vitals filed for this visit.  Visit Diagnosis:  Presence of right artificial knee joint  Right knee gives way  Stiffness of knee joint, right  Increased weakness when walking      Subjective Assessment - 05/02/15 1717    Subjective Pt reports compliance with HEP.  STates her knee is clicking with mobilty; doesnt hurt only anoying.    Currently in Pain? No/denies                         [facility] - 05/02/15 1605    Knee/Hip Exercises: Stretches   Active Hamstring Stretch 3 reps;30 seconds   Active Hamstring Stretch Limitations standing 12in step   Knee: Self-Stretch Limitations 10x on 12 in step   Gastroc Stretch 3 reps;30 seconds   Gastroc Stretch Limitations slant board    Knee/Hip Exercises: Aerobic   Stationary Bike 8' seat 8 for ROM   Knee/Hip Exercises: Standing   Heel Raises 10 reps   Heel Raises Limitations toeraises 10 reps   Knee Flexion Left;10 reps   Forward Lunges Both;10 reps   Forward Lunges Limitations floor   Lateral Step Up Right;10 reps;Step Height: 4";Hand Hold: 1   Forward Step Up Right;10 reps;Step Height: 4";Hand Hold: 1   Step Down Right;10 reps;Step Height: 4";Hand Hold: 1   Functional Squat 10 reps   Stairs 4" reciprocally without HR 2RT   Gait Training no AD X 450 feet                  PT Short Term Goals - 04/27/15 1400    PT SHORT TERM GOAL #1   Title Pt will demonstrate independence in HEP by performing at least 1x daily without assistance.    Baseline advanced HEP given today   Status On-going   PT SHORT TERM GOAL #2   Title Pt will demonstrate improved R knee ROM as evdienced by PROM of 105 degrees, in order to improve ability to exit/enter vehicle.    Baseline 04/28/2015 115 degrees of knee flexion   Status Achieved   PT SHORT TERM GOAL #3  Title Pt will increase active walking tolerance to 2 hours to demonstrate great activity tolerance during IADL.    Status On-going           PT Long Term Goals - 04/27/15 1401    PT LONG TERM GOAL #1   Title Pt will demonstrate improved R knee ROM as evdienced by PROM of 120 degrees, in order to improve ability to exit/enter vehicle and climb stairs.     Status On-going   PT LONG TERM GOAL #2   Title Pt will increase active walking tolerance to 4 hours to demonstrate greater activity tolerance in order to return to work and/or PLOF of leisure activities.    PT LONG TERM GOAL #3   Title Pt will ambulated 1048ft independently at 1.58m/s or greater to demonstrate ability to perform safe community ambulation.                Plan - 05/02/15 1712    Clinical Impression Statement Pt is progressing well towards goals with improving ROM, strength, and  independence with gait.  Reported stairs and ambulation without her Navos were the most difficult at this point.  Progressed to standing exericses focusing on eccentric control.  Added reciprocal stair negotiation and gait without AD.  Pt with only slight gait deviation with gait today being decreased stance time on Rt.  No pain or diffcutly with exericse progression.   PT Next Visit Plan  Continue with stretches, continue functinal strengthening with lunges and progress balance.          Problem List Patient Active Problem List   Diagnosis Date Noted  . Hypokalemia 04/13/2015  . HTN (hypertension) 04/13/2015  . Arthritis of knee 04/09/2015  . Bilateral knee pain 03/31/2013  . Pes anserine bursitis 02/08/2013  . Patella, chondromalacia 01/04/2013  . OA (osteoarthritis) of knee 04/22/2011  . Primary osteoarthritis of right knee 09/23/2010  . DERANGEMENT MENISCUS 09/23/2010    Lurena Nida, PTA/CLT [phone]  05/02/2015, 5:19 PM  [facility] [address] [place], [place], [address] Phone: [phone]   Fax:  [phone]

## 2015-05-03 ENCOUNTER — Ambulatory Visit (HOSPITAL_COMMUNITY): Payer: BLUE CROSS/BLUE SHIELD

## 2015-05-03 DIAGNOSIS — M25361 Other instability, right knee: Secondary | ICD-10-CM

## 2015-05-03 DIAGNOSIS — Z96651 Presence of right artificial knee joint: Secondary | ICD-10-CM

## 2015-05-03 DIAGNOSIS — Z471 Aftercare following joint replacement surgery: Secondary | ICD-10-CM | POA: Diagnosis not present

## 2015-05-03 DIAGNOSIS — R531 Weakness: Secondary | ICD-10-CM

## 2015-05-03 DIAGNOSIS — M25661 Stiffness of right knee, not elsewhere classified: Secondary | ICD-10-CM

## 2015-05-03 NOTE — Therapy (Signed)
[place], [place], [address] Phone: [phone]   Fax:  [phone]  Physical Therapy Treatment  Patient Details  Name: Autumn Pittman MRN: [id number] Date of Birth: July 10, 1956 Referring Provider:  Sinda Du, MD  Encounter Date: 05/03/2015      PT End of Session - 05/03/15 1310    Visit Number 4   Number of Visits 18   Date for PT Re-Evaluation 05/24/15   Authorization Type BCBS Anthem    Authorization Time Period 04/24/2015-06/24/2015   Authorization - Visit Number 4   Authorization - Number of Visits 18   PT Start Time 1302   PT Stop Time 1346   PT Time Calculation (min) 44 min   Activity Tolerance Patient tolerated treatment well   Behavior During Therapy [facility] for tasks assessed/performed      Past Medical History  Diagnosis Date  . HTN (hypertension)     Past Surgical History  Procedure Laterality Date  . Varicose vein removal      rt leg   . Eye surgery Bilateral 02/2008    lasik   . Total knee arthroplasty Right 04/09/2015    Procedure: RIGHT TOTAL KNEE ARTHROPLASTY;  Surgeon: Frederik Pear, MD;  Location: [place];  Service: Orthopedics;  Laterality: Right;    There were no vitals filed for this visit.  Visit Diagnosis:  Presence of right artificial knee joint  Right knee gives way  Stiffness of knee joint, right  Increased weakness when walking      Subjective Assessment - 05/03/15 1307    Subjective Pt stated no reports of pain, mainly stiffness.  Compliant with HEP and riding reciprocal bicycle at home   Currently in Pain? No/denies            [facility] PT Assessment - 05/03/15 0001    Assessment   Medical Diagnosis R TKA   Onset Date/Surgical Date 04/09/15   Next MD Visit Mayer Camel 05/22/15   Prior Therapy Attempted, unsuccessful.              [place] Adult PT Treatment/Exercise - 05/03/15 0001    Exercises   Exercises Knee/Hip   Knee/Hip Exercises: Stretches   Active  Hamstring Stretch 3 reps;30 seconds   Active Hamstring Stretch Limitations standing 12in step   Knee: Self-Stretch Limitations 10x on 12 in step   Piriformis Stretch 2 reps;30 seconds   Piriformis Stretch Limitations supine figure 4   Gastroc Stretch 3 reps;30 seconds   Gastroc Stretch Limitations slant board   Knee/Hip Exercises: Aerobic   Stationary Bike 8' seat 8 for ROM   Knee/Hip Exercises: Standing   Forward Lunges Both;10 reps   Forward Lunges Limitations floor   Side Lunges Both;10 reps   Side Lunges Limitations floor   Lateral Step Up Right;10 reps;Step Height: 4";Hand Hold: 1   Lateral Step Up Limitations cueing to reduce hip drop   Forward Step Up Right;10 reps;Step Height: 4";Hand Hold: 1   Functional Squat 10 reps   Functional Squat Limitations 3D hip excursion   Gait Training no AD x 226 with appropraite weight distrubtion followiong ceuing   Manual Therapy   Manual Therapy Myofascial release   Manual therapy comments Scar tissue massage                PT Education - 05/03/15 1346    Education provided Yes   Education Details Educated on proper hip mobility, add piriformis stretch HEP; Reviewed technique with  scar massage   Person(s) Educated Patient   Methods Explanation;Demonstration;Handout   Comprehension Verbalized understanding;Returned demonstration          PT Short Term Goals - 05/03/15 1311    PT SHORT TERM GOAL #1   Title Pt will demonstrate independence in HEP by performing at least 1x daily without assistance.    Status On-going   PT SHORT TERM GOAL #2   Title Pt will demonstrate improved R knee ROM as evdienced by PROM of 105 degrees, in order to improve ability to exit/enter vehicle.    Status Achieved   PT SHORT TERM GOAL #3   Title Pt will increase active walking tolerance to 2 hours to demonstrate great activity tolerance during IADL.    Status On-going           PT Long Term Goals - 05/03/15 1312    PT LONG TERM GOAL #1    Title Pt will demonstrate improved R knee ROM as evdienced by PROM of 120 degrees, in order to improve ability to exit/enter vehicle and climb stairs.     Baseline AROM 05/03/2015 AROM 0-120   Status Partially Met   PT LONG TERM GOAL #2   Title Pt will increase active walking tolerance to 4 hours to demonstrate greater activity tolerance in order to return to work and/or PLOF of leisure activities.    PT LONG TERM GOAL #3   Title Pt will ambulated 102f independently at 1.274m or greater to demonstrate ability to perform safe community ambulation.                Plan - 05/03/15 1335    Clinical Impression Statement Pt improving gait mechanics and AROM 0-120.  Pt able to demonstrate appropriate gait mechanics without AD with minimal cueing required to equalize stance phase with Rt LE.  Noted restricted hip mobilty during gait, added 3D hip excursion and piriformis stretch.  Pt given additional HEP for piriformis stretch.  Pt stated slight increased in Rt knee pain during lateral stair training, cueing required to reduce hip drop.  Ended session with scar tissue massage to improve fiber direction to improve AROM and reduce pain.     PT Next Visit Plan  Continue with stretches, continue functinal strengthening with lunges and progress balance.          Problem List Patient Active Problem List   Diagnosis Date Noted  . Hypokalemia 04/13/2015  . HTN (hypertension) 04/13/2015  . Arthritis of knee 04/09/2015  . Bilateral knee pain 03/31/2013  . Pes anserine bursitis 02/08/2013  . Patella, chondromalacia 01/04/2013  . OA (osteoarthritis) of knee 04/22/2011  . Primary osteoarthritis of right knee 09/23/2010  . DERANGEMENT MENISCUS 09/23/2010   CaAldona LentoPTA  CoAldona Lento/07/2015, 2:57 PM  [id number]: [phone] Fax:  [phone]

## 2015-05-03 NOTE — Patient Instructions (Signed)
Piriformis Stretch, Supine    Lie supine, one ankle crossed onto opposite knee. Holding bottom leg behind knee, gently pull legs toward chest until stretch is felt in buttock of top leg. Hold 30 seconds. For deeper stretch gently push top knee away from body.  Repeat 3 times per session. Do 1-2 sessions per day.  Copyright  VHI. All rights reserved.   

## 2015-05-15 ENCOUNTER — Telehealth (HOSPITAL_COMMUNITY): Payer: Self-pay

## 2015-05-15 NOTE — Telephone Encounter (Signed)
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

## 2015-05-16 ENCOUNTER — Ambulatory Visit (HOSPITAL_COMMUNITY): Payer: BLUE CROSS/BLUE SHIELD | Admitting: Physical Therapy

## 2015-05-17 ENCOUNTER — Ambulatory Visit (HOSPITAL_COMMUNITY): Payer: BLUE CROSS/BLUE SHIELD | Admitting: Physical Therapy

## 2015-05-22 ENCOUNTER — Ambulatory Visit (HOSPITAL_COMMUNITY): Payer: BLUE CROSS/BLUE SHIELD | Admitting: Physical Therapy

## 2015-05-23 ENCOUNTER — Ambulatory Visit (HOSPITAL_COMMUNITY): Payer: BLUE CROSS/BLUE SHIELD | Admitting: Physical Therapy

## 2015-05-23 DIAGNOSIS — R531 Weakness: Secondary | ICD-10-CM

## 2015-05-23 DIAGNOSIS — M25661 Stiffness of right knee, not elsewhere classified: Secondary | ICD-10-CM

## 2015-05-23 DIAGNOSIS — M25361 Other instability, right knee: Secondary | ICD-10-CM

## 2015-05-23 DIAGNOSIS — Z471 Aftercare following joint replacement surgery: Secondary | ICD-10-CM | POA: Diagnosis not present

## 2015-05-23 DIAGNOSIS — Z96651 Presence of right artificial knee joint: Secondary | ICD-10-CM

## 2015-05-24 NOTE — Therapy (Signed)
[place], [place], [address] Phone: [phone]   Fax:  [phone]  Physical Therapy Treatment  Patient Details  Name: Autumn Pittman MRN: [id number] Date of Birth: [birth year] Referring Provider:  Frederik Pear, MD  Encounter Date: 05/23/2015      PT End of Session - 05/23/15 0856    Visit Number 5   Number of Visits 18   Date for PT Re-Evaluation 05/24/15   Authorization Type [employer] An[employer]    Authorization Time Period 04/24/2015-06/24/2015   Authorization - Visit Number 5   Authorization - Number of Visits 18   PT Start Time [phone]   PT Stop Time 0932   PT Time Calculation (min) 40 min   Activity Tolerance Patient tolerated treatment well   Behavior During Therapy [facility] for tasks assessed/performed      Past Medical History  Diagnosis Date  . HTN (hypertension)     Past Surgical History  Procedure Laterality Date  . Varicose vein removal      rt leg   . Eye surgery Bilateral 02/2008    lasik   . Total knee arthroplasty Right 04/09/2015    Procedure: RIGHT TOTAL KNEE ARTHROPLASTY;  Surgeon: Frederik Pear, MD;  Location: [place];  Service: Orthopedics;  Laterality: Right;    There were no vitals filed for this visit.  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  States she has not been doing her HEP as often as she should but she has been walking more since returning back to work.     Currently in Pain? No/denies                         [facility] Adult PT Treatment/Exercise - 05/23/15 0851    Knee/Hip Exercises: Stretches   Active Hamstring Stretch 3 reps;30 seconds   Active Hamstring Stretch Limitations standing 12in step   Knee: Self-Stretch  Limitations 10x on 12 in step   Piriformis Stretch 30 seconds;3 reps   Piriformis Stretch Limitations supine figure 4   Gastroc Stretch 3 reps;30 seconds   Gastroc Stretch Limitations slant board   Knee/Hip Exercises: Standing   Forward Lunges Both;10 reps   Forward Lunges Limitations floor   Side Lunges Both;10 reps   Side Lunges Limitations floor   Lateral Step Up Right;10 reps;Hand Hold: 1;Step Height: 6"   Forward Step Up Right;10 reps;Hand Hold: 1;Step Height: 6"   Step Down Right;10 reps;Step Height: 4";Hand Hold: 1   Functional Squat 10 reps   Stairs 7" reciprocally    SLS with Vectors SLS balance reach off 2" step 2X5 reps Rt LE   Manual Therapy   Manual Therapy Myofascial release   Manual therapy comments Scar tissue massage Lateral knee                  PT Short Term Goals - 05/03/15 1311    PT SHORT TERM GOAL #1   Title Pt will demonstrate independence in HEP by performing at least 1x daily without assistance.    Status On-going   PT SHORT TERM GOAL #2   Title Pt will demonstrate improved R knee ROM as  evdienced by PROM of 105 degrees, in order to improve ability to exit/enter vehicle.    Status Achieved   PT SHORT TERM GOAL #3   Title Pt will increase active walking tolerance to 2 hours to demonstrate great activity tolerance during IADL.    Status On-going           PT Long Term Goals - 05/03/15 1312    PT LONG TERM GOAL #1   Title Pt will demonstrate improved R knee ROM as evdienced by PROM of 120 degrees, in order to improve ability to exit/enter vehicle and climb stairs.     Baseline AROM 05/03/2015 AROM 0-120   Status Partially Met   PT LONG TERM GOAL #2   Title Pt will increase active walking tolerance to 4 hours to demonstrate greater activity tolerance in order to return to work and/or PLOF of leisure activities.    PT LONG TERM GOAL #3   Title Pt will ambulated 1059f independently at 1.279m or greater to demonstrate ability to perform safe  community ambulation.                Plan - 05/24/15 0857    Clinical Impression Statement Pt able to complete all exericses with therapist facilitation and verbal cues.  Pt with some discomfort in her lateral knee with actvities.  Able to complete myofascial techniques with relief of pain and tightness and improving gait at end of session.   PT Next Visit Plan  Continue with stretches, continue functinal strengthening with lunges and progress balance.          Problem List Patient Active Problem List   Diagnosis Date Noted  . Hypokalemia 04/13/2015  . HTN (hypertension) 04/13/2015  . Arthritis of knee 04/09/2015  . Bilateral knee pain 03/31/2013  . Pes anserine bursitis 02/08/2013  . Patella, chondromalacia 01/04/2013  . OA (osteoarthritis) of knee 04/22/2011  . Primary osteoarthritis of right knee 09/23/2010  . DERANGEMENT MENISCUS 09/23/2010   AmTeena IraniPTA/CLT [id number]/30/2016, 9:01 AM  [id number]: [phone] Fax:  [phone]

## 2015-05-25 ENCOUNTER — Ambulatory Visit (HOSPITAL_COMMUNITY): Payer: BLUE CROSS/BLUE SHIELD

## 2015-05-25 ENCOUNTER — Telehealth (HOSPITAL_COMMUNITY): Payer: Self-pay

## 2015-05-29 ENCOUNTER — Ambulatory Visit (HOSPITAL_COMMUNITY): Payer: BLUE CROSS/BLUE SHIELD | Attending: Orthopedic Surgery

## 2015-05-29 DIAGNOSIS — Z96651 Presence of right artificial knee joint: Secondary | ICD-10-CM

## 2015-05-29 DIAGNOSIS — R531 Weakness: Secondary | ICD-10-CM | POA: Diagnosis present

## 2015-05-29 DIAGNOSIS — M25661 Stiffness of right knee, not elsewhere classified: Secondary | ICD-10-CM | POA: Insufficient documentation

## 2015-05-29 DIAGNOSIS — M25361 Other instability, right knee: Secondary | ICD-10-CM | POA: Insufficient documentation

## 2015-05-29 NOTE — Therapy (Signed)
[place] [facility] [address] [place], [place], [address] Phone: [phone]   Fax:  [phone]  Physical Therapy Treatment (Re-Assessment)  Patient Details  Name: Autumn PhenixDaun K Pittman MRN: [id number] Date of Birth: 11-04-1956 Referring Provider:  Gean Birchwoodowan, Frank, MD  Encounter Date: 05/29/2015      PT End of Session - 05/29/15 0852    Visit Number 6   Number of Visits 18   Date for PT Re-Evaluation 06/24/15   Authorization Type [employer] Anthem    Authorization Time Period 04/24/2015-06/24/2015   Authorization - Visit Number 6   Authorization - Number of Visits 18   PT Start Time 0800   PT Stop Time 0856   PT Time Calculation (min) 56 min   Activity Tolerance Patient tolerated treatment well   Behavior During Therapy [facility] for tasks assessed/performed      Past Medical History  Diagnosis Date  . HTN (hypertension)     Past Surgical History  Procedure Laterality Date  . Varicose vein removal      rt leg   . Eye surgery Bilateral 02/2008    lasik   . Total knee arthroplasty Right 04/09/2015    Procedure: RIGHT TOTAL KNEE ARTHROPLASTY;  Surgeon: Gean BirchwoodFrank Rowan, MD;  [facility];  Service: Orthopedics;  Laterality: Right;    There were no vitals filed for this visit.  Visit Diagnosis:  Presence of right artificial knee joint  Right knee gives way  Stiffness of knee joint, right  Increased weakness when walking      Subjective Assessment - 05/29/15 0805    Subjective Pt stated she has done a lot of standing over weekend cooking, has been applying ice and completeing HEP.  Current pain scale 3/10 lateral Rt knee.  Most difficulties current with pain from standing long periods, swelling and ROM.  Stated improvements with stairs through does have difficulty descending.   How long can you sit comfortably? Able to sit comfortably for half hour   How long can you stand comfortably? Able to stand comfortably for 30 minutes   How long can you walk  comfortably? Able to walk for 30 minutes   Currently in Pain? Yes   Pain Score 3    Pain Location Knee            [facility] - 05/29/15 0001    Assessment   Medical Diagnosis R TKA   Onset Date/Surgical Date 04/09/15   Next MD Visit Turner Danielsowan August 2016   Prior Therapy Attempted, unsuccessful.   Observation/Other Assessments   Focus on Therapeutic Outcomes (FOTO)  60.6%  was 51.39%   PROM   Right Hip External Rotation  48   Right Hip Internal Rotation  40  was 25   Left Hip Extension 30  was 30   Left Hip Flexion 150  was 150   Left Hip External Rotation  60   Left Hip Internal Rotation  38  was 20    Right/Left Knee Right;Left   Left Knee Extension -1  was -6   Left Knee Flexion 118  Rt knee 118, Lt 138   Right/Left Ankle Right;Left   Left Ankle Dorsiflexion 20  Right was 18   Left Ankle Plantar Flexion 55  was 55   Strength   Right Hip Flexion 4-/5  was 3+/5   Right Hip Extension 4+/5  was 4+/5   Right Hip ABduction 4/5   Right Hip ADduction 5/5   Left Hip  Flexion 5/5   Left Hip Extension 4/5  was 4/5   Left Hip ABduction 4/5   Left Hip ADduction 5/5   Right Knee Flexion 5/5   Right Knee Extension 4/5  weas 4-/5   Left Knee Flexion 5/5   Left Knee Extension 5/5   Right Ankle Dorsiflexion 5/5   Left Ankle Dorsiflexion 5/5                     [facility] Adult PT Treatment/Exercise - 05/29/15 0001    Ambulation/Gait   Ambulation/Gait Yes   Ambulation/Gait Assistance 7: Independent   Ambulation Distance (Feet) 581 Feet  walk; 1.57m/s   Assistive device None   Gait Pattern Decreased stance time - right   Exercises   Exercises Knee/Hip   Knee/Hip Exercises: Stretches   Active Hamstring Stretch 3 reps;30 seconds   Active Hamstring Stretch Limitations standing 12in step   Quad Stretch 3 reps;30 seconds   Quad Stretch Limitations prone with rope   Knee: Self-Stretch Limitations 10x on 12 in step   Piriformis Stretch 30  seconds;3 reps   Piriformis Stretch Limitations supine figure 4   Gastroc Stretch 3 reps;30 seconds   Gastroc Stretch Limitations slant board   Knee/Hip Exercises: Aerobic   Stationary Bike done at home   Knee/Hip Exercises: Standing   Lateral Step Up Right;Hand Hold: 1;Step Height: 6";15 reps   Lateral Step Up Limitations cueing to reduce hip drop   Forward Step Up Right;Hand Hold: 1;Step Height: 6";15 reps   Step Down Right;10 reps;Hand Hold: 1;Step Height: 6"   Step Down Limitations cueng for eccentric control   Functional Squat 10 reps   Knee/Hip Exercises: Supine   Quad Sets Right;10 reps   Heel Slides Right;10 reps   Heel Slides Limitations 1-118                  PT Short Term Goals - 05/29/15 0810    PT SHORT TERM GOAL #1   Title Pt will demonstrate independence in HEP by performing at least 1x daily without assistance.    Baseline 05/29/2015 Reports compliance 1x daily   Status Achieved   PT SHORT TERM GOAL #2   Title Pt will demonstrate improved R knee ROM as evdienced by PROM of 105 degrees, in order to improve ability to exit/enter vehicle.    Baseline 04/28/2015 115 degrees of knee flexion   Status Achieved   PT SHORT TERM GOAL #3   Title Pt will increase active walking tolerance to 2 hours to demonstrate great activity tolerance during IADL.    Baseline 05/29/2015 45 minutes   Status On-going           PT Long Term Goals - 05/29/15 1610    PT LONG TERM GOAL #1   Title Pt will demonstrate improved R knee ROM as evdienced by PROM of 120 degrees, in order to improve ability to exit/enter vehicle and climb stairs.     PT LONG TERM GOAL #2   Title Pt will increase active walking tolerance to 4 hours to demonstrate greater activity tolerance in order to return to work and/or PLOF of leisure activities.    PT LONG TERM GOAL #3   Title Pt will ambulated 1078ft independently at 1.74m/s or greater to demonstrate ability to perform safe community ambulation.     Baseline 6 minute walk 581 feet= 1.8m/s   Status Achieved  Plan - 05/29/15 0932    Clinical Impression Statement Reassessment complete with the following findings:  Pt reports compoliance with advanced HEP daily.  Strength progressing well though noted deficitis for all LE musculature.  AROM 1-118 with ability to PROM to 120 degrees flexion.  Pt with increased gait speed, does continue to ambulate with decreased stance phase due to pain on lateral aspect of Rt knee and weakness..  Pt reports tolerance to sit, stand and walk for 30-45 minutes tops with reports of increased pain and swelling.  Pt will continue to benefit from skilled intervention to improve these deficits.   PT Next Visit Plan Recommend continuing OPPT for 4 more weeks to improve gait mechanics, AROM and functional strengthneing, Manual PRN.  Continue with stretches, functional strengthening with lunges, stairs and progress balance.        Problem List Patient Active Problem List   Diagnosis Date Noted  . Hypokalemia 04/13/2015  . HTN (hypertension) 04/13/2015  . Arthritis of knee 04/09/2015  . Bilateral knee pain 03/31/2013  . Pes anserine bursitis 02/08/2013  . Patella, chondromalacia 01/04/2013  . OA (osteoarthritis) of knee 04/22/2011  . Primary osteoarthritis of right knee 09/23/2010  . DERANGEMENT MENISCUS 09/23/2010   Juel Burrow, PTA  Juel Burrow 05/29/2015, 9:47 AM   Physical Therapy Progress Note  Dates of Reporting Period: 04/27/15 to 05/29/15  Objective Reports of Subjective Statement: Patient reports she is improving but continues to have pain with extended standing and also has difficulty with stair descent   Objective Measurements: see above   Goal Update: see above   Plan: see above   Reason Skilled Services are Required: continue to improve gait mechanics, ROM, and functional strength and stability, also improve stair performance, pain reduction    Nedra Hai PT, DPT [phone]     [facility] [address] [place], [place], [address] Phone: [phone]   Fax:  [phone]

## 2015-05-30 ENCOUNTER — Ambulatory Visit (HOSPITAL_COMMUNITY): Payer: BLUE CROSS/BLUE SHIELD | Admitting: Physical Therapy

## 2015-05-30 DIAGNOSIS — Z96651 Presence of right artificial knee joint: Secondary | ICD-10-CM | POA: Diagnosis not present

## 2015-05-30 DIAGNOSIS — R531 Weakness: Secondary | ICD-10-CM

## 2015-05-30 DIAGNOSIS — M25661 Stiffness of right knee, not elsewhere classified: Secondary | ICD-10-CM

## 2015-05-30 DIAGNOSIS — M25361 Other instability, right knee: Secondary | ICD-10-CM

## 2015-05-30 NOTE — Therapy (Signed)
[place], [place], [address] Phone: [phone]   Fax:  [phone]  Physical Therapy Treatment  Patient Details  Name: Autumn Pittman MRN: [id number] Date of Birth: 1956-03-17 Referring Provider:  Frederik Pear, MD  Encounter Date: 05/30/2015      PT End of Session - 05/30/15 0843    Visit Number 7   Number of Visits 18   Date for PT Re-Evaluation 06/24/15   Authorization Type [employer] Anthem    Authorization Time Period 04/24/2015-06/24/2015   Authorization - Visit Number 7   Authorization - Number of Visits 18   PT Start Time 0805   PT Stop Time 0845   PT Time Calculation (min) 40 min   Activity Tolerance Patient tolerated treatment well   Behavior During Therapy [facility] for tasks assessed/performed      Past Medical History  Diagnosis Date  . HTN (hypertension)     Past Surgical History  Procedure Laterality Date  . Varicose vein removal      rt leg   . Eye surgery Bilateral 02/2008    lasik   . Total knee arthroplasty Right 04/09/2015    Procedure: RIGHT TOTAL KNEE ARTHROPLASTY;  Surgeon: Frederik Pear, MD;  Location: [place];  Service: Orthopedics;  Laterality: Right;    There were no vitals filed for this visit.  Visit Diagnosis:  Presence of right artificial knee joint  Right knee gives way  Stiffness of knee joint, right  Increased weakness when walking      Subjective Assessment - 05/30/15 0807    Subjective Patient arrived with cane stating that she was having some pain this morning, which started after re-assessment last session. Patient reports she has been continuing to ice at home.    Pertinent History History of multiple scopes and injections between both knees over last 15 years, including a L distal tibial fracture sustained during inline skating.    Currently in Pain? Yes   Pain Score 4    Pain Location Knee                         [facility] Adult PT Treatment/Exercise -  05/30/15 0001    Ambulation/Gait   Gait Comments gait speed 1.23m/s   Knee/Hip Exercises: Stretches   Active Hamstring Stretch 3 reps;30 seconds   Active Hamstring Stretch Limitations standing 12in step   Quad Stretch 3 reps;30 seconds   Quad Stretch Limitations prone    Knee: Self-Stretch Limitations 10x on 12 in step   Piriformis Stretch 2 reps;30 seconds   Piriformis Stretch Limitations seated   Gastroc Stretch 3 reps;30 seconds   Gastroc Stretch Limitations slant board   Knee/Hip Exercises: Standing   Heel Raises Both;1 set;15 reps   Heel Raises Limitations heel raises   Forward Lunges Both;1 set;15 reps   Forward Lunges Limitations floor    Side Lunges Both;1 set;15 reps   Side Lunges Limitations floor    Forward Step Up 1 set;15 reps;Right   Forward Step Up Limitations 6 inch box    Step Down Both;1 set;10 reps   Step Down Limitations 4 inch box, cues for eccentric control    Other Standing Knee Exercises 3D hip excursions 1x10; hip IR box walks 1x15   Other Standing Knee Exercises Eccentric sit 1x10 with slow lower; standing hip ABD walks 2x34ft  PT Education - 05/30/15 0843    Education provided No          PT Short Term Goals - 05/29/15 0810    PT SHORT TERM GOAL #1   Title Pt will demonstrate independence in HEP by performing at least 1x daily without assistance.    Baseline 05/29/2015 Reports compliance 1x daily   Status Achieved   PT SHORT TERM GOAL #2   Title Pt will demonstrate improved R knee ROM as evdienced by PROM of 105 degrees, in order to improve ability to exit/enter vehicle.    Baseline 04/28/2015 115 degrees of knee flexion   Status Achieved   PT SHORT TERM GOAL #3   Title Pt will increase active walking tolerance to 2 hours to demonstrate great activity tolerance during IADL.    Baseline 05/29/2015 45 minutes   Status On-going           PT Long Term Goals - 05/30/15 0850    PT LONG TERM GOAL #1   Title Pt will  demonstrate improved R knee ROM as evdienced by PROM of 120 degrees, in order to improve ability to exit/enter vehicle and climb stairs.     Baseline AROM 05/03/2015 AROM 0-120   Time 8   Period Weeks   Status Partially Met   PT LONG TERM GOAL #2   Title Pt will increase active walking tolerance to 4 hours to demonstrate greater activity tolerance in order to return to work and/or PLOF of leisure activities.    Baseline 90 minutes   Time 8   Period Weeks   Status New   PT LONG TERM GOAL #3   Title Pt will ambulated 106ft independently at 1.11m/s or greater to demonstrate ability to perform safe community ambulation.    Baseline 7/6- Re-measured gait speed on 7/6 at 1.98m/s    Time 8   Period Weeks   Status On-going               Plan - 05/30/15 2130    Clinical Impression Statement Continued functional exercises today with no increase in pain by patient. Re-measured gait speed today with result of 1.87m/s, which appears to be more accurate measure of gait. Patient able to complete all functional exercises today with good form and only occasional cues from PT.    Pt will benefit from skilled therapeutic intervention in order to improve on the following deficits Decreased range of motion;Difficulty walking;Impaired tone;Obesity;Decreased activity tolerance;Decreased skin integrity;Pain;Decreased balance;Increased edema;Decreased strength;Decreased mobility   Rehab Potential Good   PT Frequency 3x / week   PT Duration 6 weeks   PT Treatment/Interventions ADLs/Self Care Home Management;Therapeutic exercise;Therapeutic activities;Functional mobility training;Stair training;Gait training;DME Instruction;Balance training;Manual techniques;Passive range of motion;Scar mobilization   PT Next Visit Plan Continue focus on gait, ROM, functional strength, manual PRN. Include eccentric strength. Balance exercises.    PT Home Exercise Plan Advanced HEP given for ROM and strengthening   Consulted  and Agree with Plan of Care Patient        Problem List Patient Active Problem List   Diagnosis Date Noted  . Hypokalemia 04/13/2015  . HTN (hypertension) 04/13/2015  . Arthritis of knee 04/09/2015  . Bilateral knee pain 03/31/2013  . Pes anserine bursitis 02/08/2013  . Patella, chondromalacia 01/04/2013  . OA (osteoarthritis) of knee 04/22/2011  . Primary osteoarthritis of right knee 09/23/2010  . DERANGEMENT MENISCUS 09/23/2010    Deniece Ree PT, DPT [place]  [address] [place], [place], [address] Phone: [phone]   Fax:  [phone]

## 2015-06-04 ENCOUNTER — Ambulatory Visit (HOSPITAL_COMMUNITY): Payer: BLUE CROSS/BLUE SHIELD | Admitting: Physical Therapy

## 2015-06-04 DIAGNOSIS — Z96651 Presence of right artificial knee joint: Secondary | ICD-10-CM | POA: Diagnosis not present

## 2015-06-04 DIAGNOSIS — R531 Weakness: Secondary | ICD-10-CM

## 2015-06-04 DIAGNOSIS — M25361 Other instability, right knee: Secondary | ICD-10-CM

## 2015-06-04 DIAGNOSIS — M25661 Stiffness of right knee, not elsewhere classified: Secondary | ICD-10-CM

## 2015-06-04 NOTE — Therapy (Signed)
[place] [facility] [address] [place], [place], [address] Phone: [phone]   Fax:  [phone]  Physical Therapy Treatment  Patient Details  Name: Autumn Pittman MRN: [id number] Date of Birth: 1956-04-29 Referring Provider:  Gean Birchwood, MD  Encounter Date: 06/04/2015      PT End of Session - 06/04/15 1212    Visit Number 8   Number of Visits 10   Date for PT Re-Evaluation 06/24/15   Authorization Type BC[employer] Anthem    Authorization Time Period 04/24/2015-06/24/2015   Authorization - Visit Number 8   Authorization - Number of Visits 10   PT Start Time 0855   PT Stop Time 0940   PT Time Calculation (min) 45 min   Activity Tolerance Patient tolerated treatment well   Behavior During Therapy [facility] for tasks assessed/performed      Past Medical History  Diagnosis Date  . HTN (hypertension)     Past Surgical History  Procedure Laterality Date  . Varicose vein removal      rt leg   . Eye surgery Bilateral 02/2008    lasik   . Total knee arthroplasty Right 04/09/2015    Procedure: RIGHT TOTAL KNEE ARTHROPLASTY;  Surgeon: Gean Birchwood, MD;  Location: [facility] OR;  Service: Orthopedics;  Laterality: Right;    There were no vitals filed for this visit.  Visit Diagnosis:  Presence of right artificial knee joint  Right knee gives way  Stiffness of knee joint, right  Increased weakness when walking      Subjective Assessment - 06/04/15 0855    Subjective No pain; I did some cooking this weekend  And was up on my feet for three hours.    Currently in Pain? No/denies            [facility] PT Assessment - 06/04/15 0001    PROM   Left Knee Extension 0   Left Knee Flexion 130   Strength   Right Hip Flexion 5/5   Right Hip Extension 5/5   Right Hip ABduction 5/5   Right Knee Flexion 5/5   Right Knee Extension 5/5                     [facility] Adult PT Treatment/Exercise - 06/04/15 0001    Knee/Hip Exercises: Aerobic   Nustep  hills L4 x 10'   Knee/Hip Exercises: Machines for Strengthening   Total Gym Leg Press 3 PL with heel very slow x 10    Knee/Hip Exercises: Standing   Heel Raises Right;10 reps   Forward Lunges Right;15 reps  on 18" box  for increased ROM    Step Down Right;15 reps;Hand Hold: 1;Step Height: 4"   Functional Squat 15 reps   Functional Squat Limitations 6" step to keep proper technique with 4# wt up on toes    Stairs 7" reciprocally   no hands    SLS with Vectors on foam x 5"   Knee/Hip Exercises: Seated   Sit to Sand 10 reps  slow                   PT Short Term Goals - 06/04/15 1216    PT SHORT TERM GOAL #1   Title Pt will demonstrate independence in HEP by performing at least 1x daily without assistance.    Baseline 05/29/2015 Reports compliance 1x daily   Period Weeks   Status Achieved   PT SHORT TERM GOAL #2   Title  Pt will demonstrate improved R knee ROM as evdienced by PROM of 105 degrees, in order to improve ability to exit/enter vehicle.    Baseline 04/28/2015 115 degrees of knee flexion   Time 4   Period Weeks   Status Achieved   PT SHORT TERM GOAL #3   Title Pt will increase active walking tolerance to 2 hours to demonstrate great activity tolerance during IADL.    Time 3   Period Weeks   Status On-going           PT Long Term Goals - 06/04/15 1216    PT LONG TERM GOAL #1   Title Pt will demonstrate improved R knee ROM as evdienced by PROM of 120 degrees, in order to improve ability to exit/enter vehicle and climb stairs.     Baseline AROM 05/03/2015 AROM 0-120   Time 8   Period Weeks   Status Achieved   PT LONG TERM GOAL #2   Title Pt will increase active walking tolerance to 4 hours to demonstrate greater activity tolerance in order to return to work and/or PLOF of leisure activities.    Baseline 90 minutes   Time 8   Period Weeks   Status On-going   PT LONG TERM GOAL #3   Title Pt will ambulated 105100ft independently at 1.2133m/s or greater to  demonstrate ability to perform safe community ambulation.    Status Achieved               Plan - 06/04/15 1213    Clinical Impression Statement Pt has noted decreased eccentric quad strength.  MM test shows normal concentric strength and pt has normal ROM at this time.  Explained to pt that we would most likely discharge at visit 10 due to pt main functional limitation at this time being endurance which will improve as pt increases her activity level.    Pt will benefit from skilled therapeutic intervention in order to improve on the following deficits Decreased range of motion;Difficulty walking;Impaired tone;Obesity;Decreased activity tolerance;Decreased skin integrity;Pain;Decreased balance;Increased edema;Decreased strength;Decreased mobility   Rehab Potential Good   PT Frequency 3x / week   PT Duration 4 weeks   PT Treatment/Interventions ADLs/Self Care Home Management;Therapeutic exercise;Therapeutic activities;Functional mobility training;Stair training;Gait training;DME Instruction;Balance training;Manual techniques;Passive range of motion;Scar mobilization   PT Next Visit Plan Continue focus on gait, ROM, functional strength,. Include eccentric strength. Balance exercises.         Problem List Patient Active Problem List   Diagnosis Date Noted  . Hypokalemia 04/13/2015  . HTN (hypertension) 04/13/2015  . Arthritis of knee 04/09/2015  . Bilateral knee pain 03/31/2013  . Pes anserine bursitis 02/08/2013  . Patella, chondromalacia 01/04/2013  . OA (osteoarthritis) of knee 04/22/2011  . Primary osteoarthritis of right knee 09/23/2010  . DERANGEMENT MENISCUS 09/23/2010    Virgina Organynthia Russell, PT CLT [phone] 06/04/2015, 12:19 PM  [place] [facility] [address] [place], [place], [address] Phone: [phone]   Fax:  [phone]

## 2015-06-06 ENCOUNTER — Ambulatory Visit (HOSPITAL_COMMUNITY): Payer: BLUE CROSS/BLUE SHIELD

## 2015-06-08 ENCOUNTER — Ambulatory Visit (HOSPITAL_COMMUNITY): Payer: BLUE CROSS/BLUE SHIELD

## 2015-06-08 NOTE — Telephone Encounter (Signed)
[place], called and left message informing missed apt. and included next apt. date and time.  [address]Story Conti, LPTA; CBIS [phone]

## 2015-06-12 ENCOUNTER — Ambulatory Visit (HOSPITAL_COMMUNITY): Payer: BLUE CROSS/BLUE SHIELD | Admitting: Physical Therapy

## 2015-06-12 DIAGNOSIS — M25661 Stiffness of right knee, not elsewhere classified: Secondary | ICD-10-CM

## 2015-06-12 DIAGNOSIS — M25361 Other instability, right knee: Secondary | ICD-10-CM

## 2015-06-12 DIAGNOSIS — Z96651 Presence of right artificial knee joint: Secondary | ICD-10-CM

## 2015-06-12 DIAGNOSIS — R531 Weakness: Secondary | ICD-10-CM

## 2015-06-12 NOTE — Therapy (Signed)
[facility] [address] [place], [place], [address] Phone: [phone]   Fax:  [phone]  Physical Therapy Treatment  Patient Details  Name: Autumn PhenixDaun K Pittman MRN: [id number] Date of Birth: 10/10/1956 Referring Provider:  Gean Birchwoodowan, Frank, MD  Encounter Date: 06/12/2015      PT End of Session - 06/12/15 1419    Visit Number 9   Number of Visits 10   Date for PT Re-Evaluation 06/24/15   Authorization Type BCBS Anthem    Authorization Time Period 04/24/2015-06/24/2015   Authorization - Visit Number 9   Authorization - Number of Visits 10   PT Start Time 1350   PT Stop Time 1429   PT Time Calculation (min) 39 min   Activity Tolerance Patient tolerated treatment well      Past Medical History  Diagnosis Date  . HTN (hypertension)     Past Surgical History  Procedure Laterality Date  . Varicose vein removal      rt leg   . Eye surgery Bilateral 02/2008    lasik   . Total knee arthroplasty Right 04/09/2015    Procedure: RIGHT TOTAL KNEE ARTHROPLASTY;  Surgeon: Gean BirchwoodFrank Rowan, MD;  Location: [facility] OR;  Service: Orthopedics;  Laterality: Right;    There were no vitals filed for this visit.  Visit Diagnosis:  No diagnosis found.      Subjective Assessment - 06/12/15 1355    Subjective Pt states she was sore for three days after the last session   Currently in Pain? No/denies                [facility] Adult PT Treatment/Exercise - 06/12/15 0001    Knee/Hip Exercises: Aerobic   Nustep hills L4 x 10'   Knee/Hip Exercises: Machines for Strengthening   Cybex Knee Extension 2 Pl x 10    Total Gym Leg Press 2.5 PL with heel very slow x 10    Knee/Hip Exercises: Standing   Heel Raises Both;10 reps   Heel Raises Limitations with squat on 8"   Forward Lunges Right;10 reps  18" for ROM   Forward Lunges Limitations both on the floor for strengthening x 10   Functional Squat 10 reps   Functional Squat Limitations 8"    Stairs 7"  reciprocally   no hands    SLS with Vectors on foam x 5"   Other Standing Knee Exercises sit to stand lowering slowly for eccentric control                   PT Short Term Goals - 06/04/15 1216    PT SHORT TERM GOAL #1   Title Pt will demonstrate independence in HEP by performing at least 1x daily without assistance.    Baseline 05/29/2015 Reports compliance 1x daily   Period Weeks   Status Achieved   PT SHORT TERM GOAL #2   Title Pt will demonstrate improved R knee ROM as evdienced by PROM of 105 degrees, in order to improve ability to exit/enter vehicle.    Baseline 04/28/2015 115 degrees of knee flexion   Time 4   Period Weeks   Status Achieved   PT SHORT TERM GOAL #3   Title Pt will increase active walking tolerance to 2 hours to demonstrate great activity tolerance during IADL.    Time 3   Period Weeks   Status On-going           PT Long Term Goals - 06/04/15 1216  PT LONG TERM GOAL #1   Title Pt will demonstrate improved R knee ROM as evdienced by PROM of 120 degrees, in order to improve ability to exit/enter vehicle and climb stairs.     Baseline AROM 05/03/2015 AROM 0-120   Time 8   Period Weeks   Status Achieved   PT LONG TERM GOAL #2   Title Pt will increase active walking tolerance to 4 hours to demonstrate greater activity tolerance in order to return to work and/or PLOF of leisure activities.    Baseline 90 minutes   Time 8   Period Weeks   Status On-going   PT LONG TERM GOAL #3   Title Pt will ambulated 1041ft independently at 1.53m/s or greater to demonstrate ability to perform safe community ambulation.    Status Achieved               Plan - 06/12/15 1420    Clinical Impression Statement Pt demonstrates better control going down steps.  Pt treatment adjusted to prevent excessive soreness.     PT Next Visit Plan anticipate discharge next treatment.  Complete reassessment         Problem List Patient Active Problem List    Diagnosis Date Noted  . Hypokalemia 04/13/2015  . HTN (hypertension) 04/13/2015  . Arthritis of knee 04/09/2015  . Bilateral knee pain 03/31/2013  . Pes anserine bursitis 02/08/2013  . Patella, chondromalacia 01/04/2013  . OA (osteoarthritis) of knee 04/22/2011  . Primary osteoarthritis of right knee 09/23/2010  . DERANGEMENT MENISCUS 09/23/2010    Virgina Organ, PT CLT [phone] 06/12/2015, 2:27 PM  [place] [facility] [address] [place], [place], [address] Phone: [phone]   Fax:  [phone]

## 2015-06-14 ENCOUNTER — Ambulatory Visit (HOSPITAL_COMMUNITY): Payer: BLUE CROSS/BLUE SHIELD | Admitting: Physical Therapy

## 2015-06-14 DIAGNOSIS — M25361 Other instability, right knee: Secondary | ICD-10-CM

## 2015-06-14 DIAGNOSIS — Z96651 Presence of right artificial knee joint: Secondary | ICD-10-CM | POA: Diagnosis not present

## 2015-06-14 DIAGNOSIS — R531 Weakness: Secondary | ICD-10-CM

## 2015-06-14 DIAGNOSIS — M25661 Stiffness of right knee, not elsewhere classified: Secondary | ICD-10-CM

## 2015-06-14 NOTE — Therapy (Addendum)
[place], [place], [address] Phone: [phone]   Fax:  [phone]  Physical Therapy Treatment  Patient Details  Name: Autumn Pittman MRN: [id number] Date of Birth: 03-Mar-1956 Referring Provider:  Frederik Pear, MD  Encounter Date: 06/14/2015      PT End of Session - 06/14/15 0935    Visit Number 10   Number of Visits 10   Authorization Type BCBS Anthem    Authorization Time Period 04/24/2015-06/24/2015   Authorization - Visit Number 10   Authorization - Number of Visits 10   PT Start Time 0850   PT Stop Time 0930   PT Time Calculation (min) 40 min   Activity Tolerance Patient tolerated treatment well   Behavior During Therapy [facility] for tasks assessed/performed      Past Medical History  Diagnosis Date  . HTN (hypertension)     Past Surgical History  Procedure Laterality Date  . Varicose vein removal      rt leg   . Eye surgery Bilateral 02/2008    lasik   . Total knee arthroplasty Right 04/09/2015    Procedure: RIGHT TOTAL KNEE ARTHROPLASTY;  Surgeon: Frederik Pear, MD;  Location: [facility];  Service: Orthopedics;  Laterality: Right;    There were no vitals filed for this visit.  Visit Diagnosis:  Presence of right artificial knee joint  Right knee gives way  Stiffness of knee joint, right  Increased weakness when walking      Subjective Assessment - 06/14/15 0853    Subjective Patient states she is feeling good this morning, having a bit of muscle soreness but otherwise no pain    Pertinent History History of multiple scopes and injections between both knees over last 15 years, including a L distal tibial fracture sustained during inline skating.    Currently in Pain? No/denies            [facility] PT Assessment - 06/14/15 0001    Observation/Other Assessments   Focus on Therapeutic Outcomes (FOTO)  30% limited    PROM   Right Hip External Rotation  --  full range    Right Hip Internal Rotation   42   Left Hip External Rotation  --  full range    Left Hip Internal Rotation  41   Right/Left Knee Right   Left Knee Extension 0  R knee was operated on    Left Knee Flexion 125  right knee, which was the one that was operated on    Strength   Right Hip Flexion 5/5   Right Hip Extension 5/5   Right Hip ABduction 5/5   Left Hip Flexion 5/5   Left Hip Extension 4+/5   Left Hip ABduction 4+/5   Right Knee Flexion 4+/5   Right Knee Extension 4+/5   Left Knee Flexion 5/5   Left Knee Extension 5/5   Right Ankle Dorsiflexion 5/5   Left Ankle Dorsiflexion 5/5   Ambulation/Gait   Gait Comments gait speed 1.1ms                      [facility] Adult PT Treatment/Exercise - 06/14/15 0001    Knee/Hip Exercises: Stretches   Active Hamstring Stretch Both;2 reps;30 seconds   Active Hamstring Stretch Limitations 12 inch box    Piriformis Stretch 2 reps;30 seconds   Piriformis Stretch Limitations seated    Gastroc Stretch 2 reps;30 seconds   Gastroc Stretch Limitations slantboard  PT Education - 06/14/15 0935    Education provided Yes   Education Details progress with skilled PT services, HEP    Person(s) Educated Patient   Methods Explanation   Comprehension Verbalized understanding          PT Short Term Goals - 06/14/15 0920    PT SHORT TERM GOAL #1   Title Pt will demonstrate independence in HEP by performing at least 1x daily without assistance.    Time 4   Period Weeks   Status Achieved   PT SHORT TERM GOAL #2   Title Pt will demonstrate improved R knee ROM as evdienced by PROM of 105 degrees, in order to improve ability to exit/enter vehicle.    Time 4   Period Weeks   Status Achieved   PT SHORT TERM GOAL #3   Title Pt will increase active walking tolerance to 2 hours to demonstrate great activity tolerance during IADL.    Baseline 7/20- was up on her feet and moving about 3 hours with no problems this weekend    Time 3   Period  Weeks   Status Achieved           PT Long Term Goals - 06/14/15 9563    PT LONG TERM GOAL #1   Title Pt will demonstrate improved R knee ROM as evdienced by PROM of 120 degrees, in order to improve ability to exit/enter vehicle and climb stairs.     Time 8   Period Weeks   Status Achieved   PT LONG TERM GOAL #2   Title Pt will increase active walking tolerance to 4 hours to demonstrate greater activity tolerance in order to return to work and/or PLOF of leisure activities.    Baseline 7/20- able to be up on feet for around 3 hours    Time 8   Period Weeks   Status Not Met   PT LONG TERM GOAL #3   Title Pt will ambulated 1051f independently at 1.278m or greater to demonstrate ability to perform safe community ambulation.    Baseline 7/20- met goal of 1.59m38mtoday, able to ambulate [id number] 6 minute walk test    Time 8   Period Weeks   Status Achieved               Plan - 06/14/15 0937    Clinical Impression Statement Discharge assessment performed today. Patient has demonstrated significant gains and progress with skilled PT services, with main remaining deficit being poor eccentric control on surgical side. Patient was given an approptiate HEP to assist in overcoming remaining deficit, and does remain very motivated to perform HEP at home in order to minimize her limitations. At this time patient is no longer in need of skilled PT services and is discharged to home management with HEP.    Pt will benefit from skilled therapeutic intervention in order to improve on the following deficits Decreased range of motion;Difficulty walking;Impaired tone;Obesity;Decreased activity tolerance;Decreased skin integrity;Pain;Decreased balance;Increased edema;Decreased strength;Decreased mobility   Rehab Potential Good   PT Next Visit Plan DC today    PT Home Exercise Plan Advanced HEP given for ROM and strengthening   Consulted and Agree with Plan of Care Patient        Problem  List Patient Active Problem List   Diagnosis Date Noted  . Hypokalemia 04/13/2015  . HTN (hypertension) 04/13/2015  . Arthritis of knee 04/09/2015  . Bilateral knee pain 03/31/2013  . Pes anserine bursitis 02/08/2013  .  Patella, chondromalacia 01/04/2013  . OA (osteoarthritis) of knee 04/22/2011  . Primary osteoarthritis of right knee 09/23/2010  . DERANGEMENT MENISCUS 09/23/2010    PHYSICAL THERAPY DISCHARGE SUMMARY  Visits from Start of Care: 10  Current functional level related to goals / functional outcomes: Patient reports she has been able to do everything she needs and wants to do but does still have trouble descending stairs    Remaining deficits: Poor eccentric control    Education / Equipment: HEP to address eccentric control  Plan: Patient agrees to discharge.  Patient goals were met. Patient is being discharged due to being pleased with the current functional level.  ?????       Deniece Ree PT, DPT [place] [address] [place], [place], [address] Phone: [phone]   Fax:  [phone]

## 2015-06-14 NOTE — Patient Instructions (Signed)
Anterior Step-Down   Stand with both feet on  step. Step down in A direction with right  foot, touching heel to the floor and return _10__ times. _1__ sets _2__ times per day.  http://gglj.exer.us/185   Copyright  VHI. All rights reserved.   Functional Quadriceps: Sit to Stand   Sit on edge of chair, feet flat on floor. Stagger your right foot further back and your left foot further forward. Stand upright, extending knees fully. Then slowly lower yourself down to the chair with a 3-5 second count.  Repeat _10___ times per set. Do __1__ sets per session. Do _2___ sessions per day.  http://orth.exer.us/735    AT ALL TIMES WITH STAIR TRAINING:  Take your time, go nice and slow with both legs, do not let yourself "drop" down the stairs- when you touch the step, it should be gentle instead of hitting the step hard.   Copyright  VHI. All rights reserved.

## 2015-06-18 ENCOUNTER — Encounter (HOSPITAL_COMMUNITY): Payer: BLUE CROSS/BLUE SHIELD | Admitting: Physical Therapy

## 2015-06-21 ENCOUNTER — Encounter (HOSPITAL_COMMUNITY): Payer: BLUE CROSS/BLUE SHIELD | Admitting: Physical Therapy

## 2015-07-06 IMAGING — CR DG CHEST 2V
2 series · 2 of 2 positions shown · non-contrast
Comparison: None.

CLINICAL DATA: Hypertension .

EXAM:
CHEST  2 VIEW

[w chest pa]
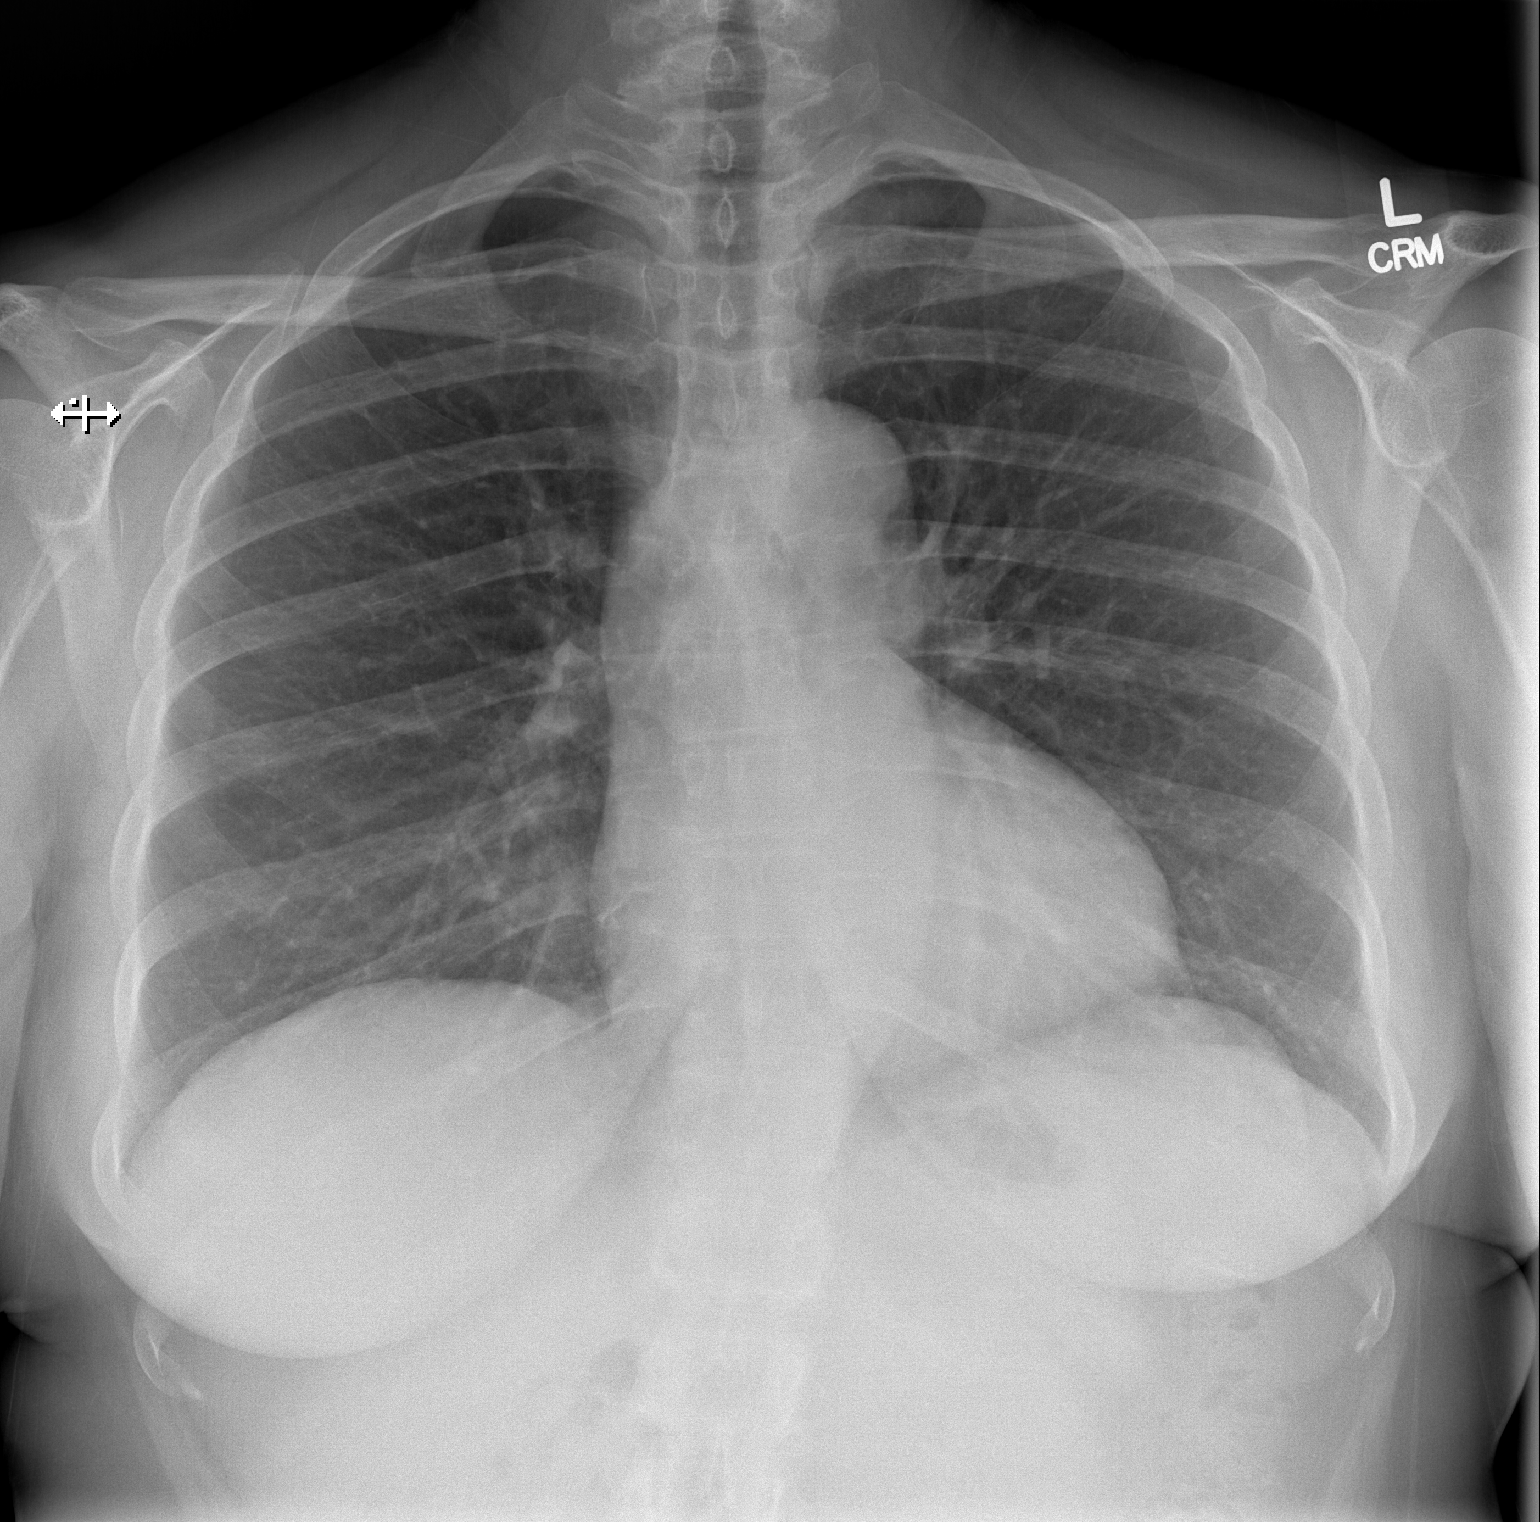

[w chest lat]
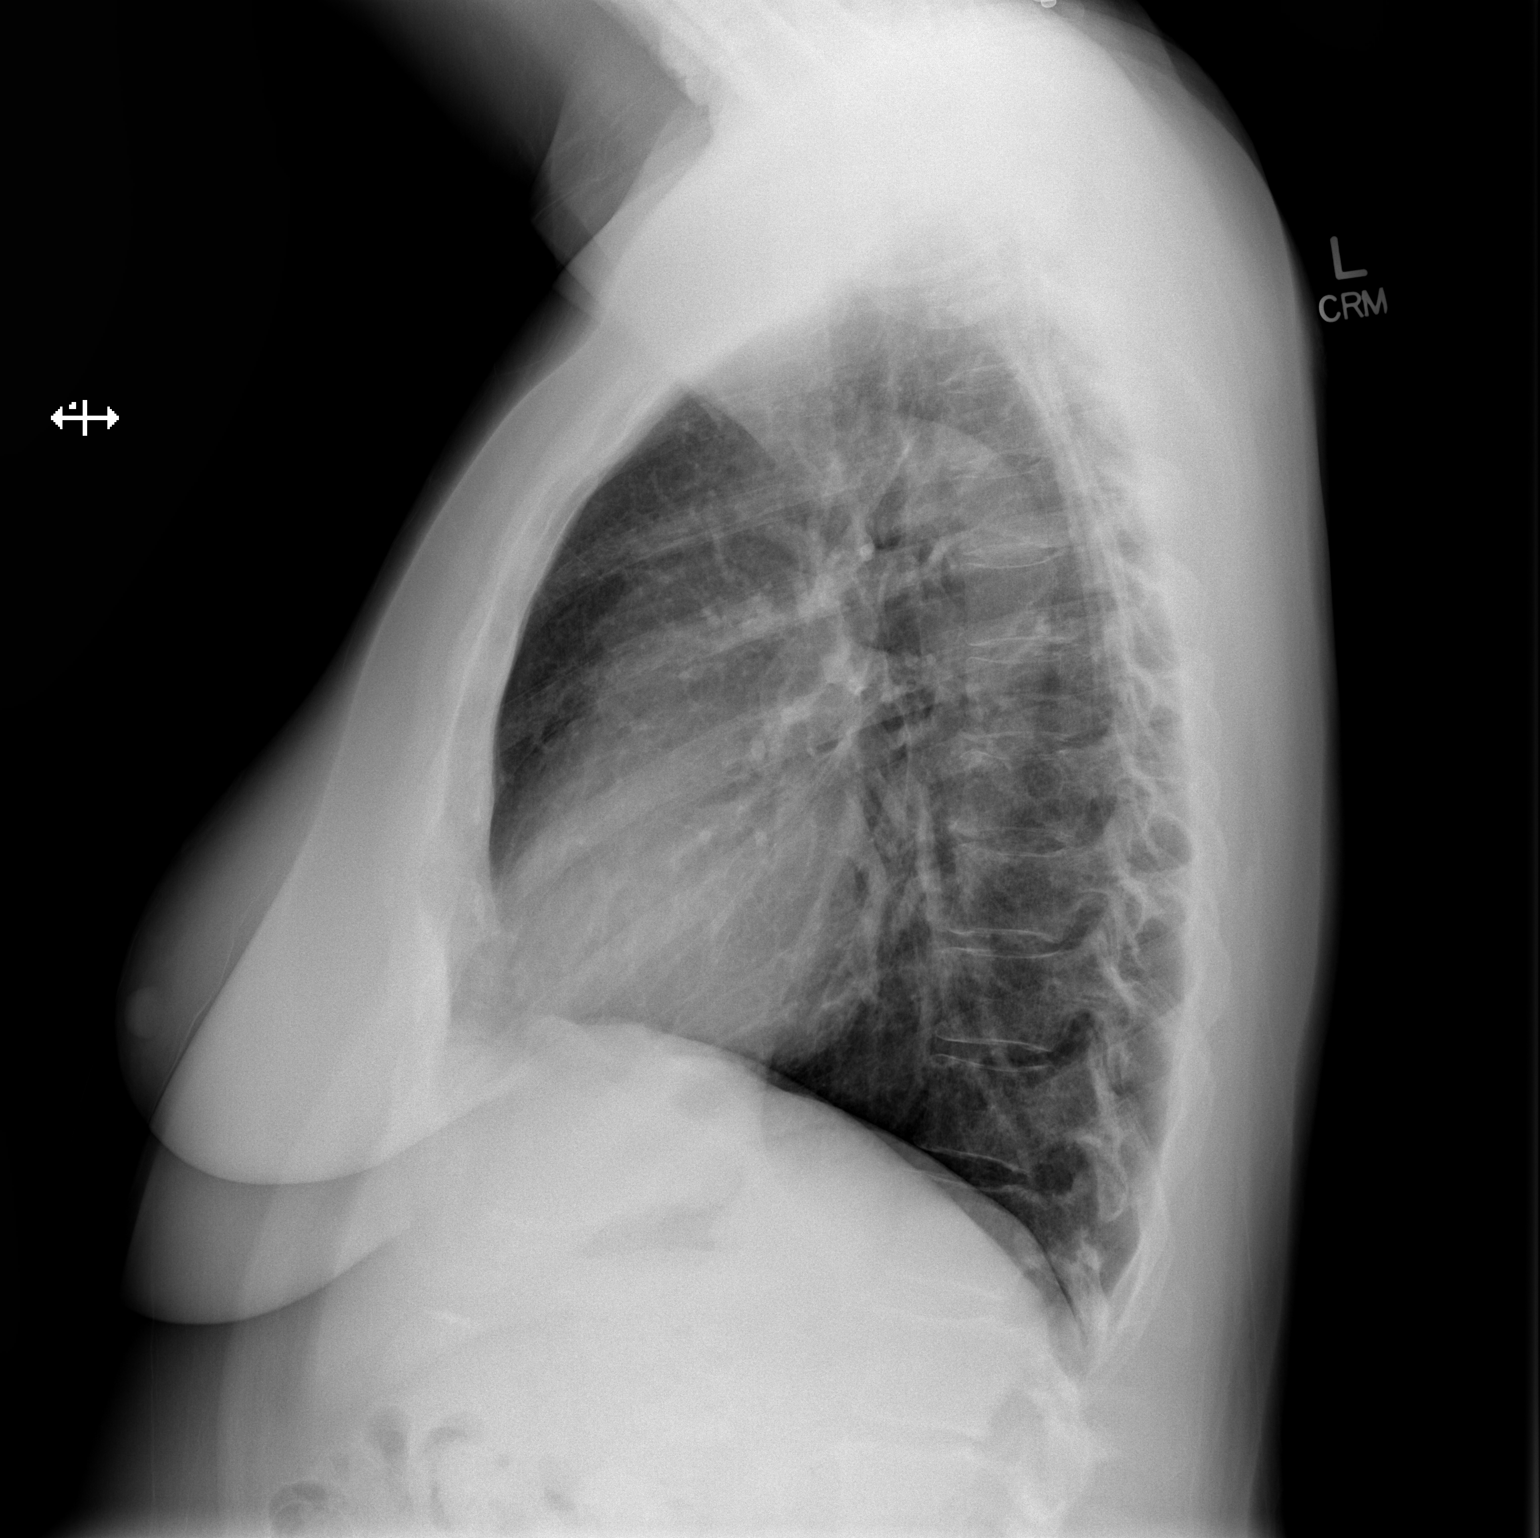

[2 of 2 positions shown; findings below may reference images not displayed]

FINDINGS: Mediastinum and hilar structures normal. Lungs are clear. Mild
cardiomegaly with normal pulmonary vascularity. No pleural effusion
or pneumothorax. No acute bony abnormality.
IMPRESSION: Mild cardiomegaly.  No evidence of CHF.

## 2018-01-05 ENCOUNTER — Other Ambulatory Visit (HOSPITAL_COMMUNITY): Payer: Self-pay | Admitting: Pulmonary Disease

## 2018-01-05 DIAGNOSIS — Z780 Asymptomatic menopausal state: Secondary | ICD-10-CM

## 2018-01-20 ENCOUNTER — Encounter (HOSPITAL_COMMUNITY): Payer: Self-pay

## 2018-01-20 ENCOUNTER — Other Ambulatory Visit (HOSPITAL_COMMUNITY): Payer: BLUE CROSS/BLUE SHIELD

## 2018-04-23 ENCOUNTER — Ambulatory Visit (HOSPITAL_COMMUNITY)
Admission: RE | Admit: 2018-04-23 | Discharge: 2018-04-23 | Disposition: A | Discharge status: Home / Self Care | Payer: BLUE CROSS/BLUE SHIELD | Source: Ambulatory Visit | Attending: Pulmonary Disease | Admitting: Pulmonary Disease

## 2018-04-23 DIAGNOSIS — N951 Menopausal and female climacteric states: Secondary | ICD-10-CM | POA: Diagnosis present

## 2018-04-23 DIAGNOSIS — M85832 Other specified disorders of bone density and structure, left forearm: Secondary | ICD-10-CM | POA: Insufficient documentation

## 2018-04-23 DIAGNOSIS — M85851 Other specified disorders of bone density and structure, right thigh: Secondary | ICD-10-CM | POA: Insufficient documentation

## 2018-04-23 DIAGNOSIS — Z780 Asymptomatic menopausal state: Secondary | ICD-10-CM

## 2021-06-15 ENCOUNTER — Emergency Department
Admission: EM | Admit: 2021-06-15 | Discharge: 2021-06-15 | Disposition: A | Discharge status: Home / Self Care | Payer: BC Managed Care – PPO | Attending: Emergency Medicine | Admitting: Emergency Medicine

## 2021-06-15 ENCOUNTER — Emergency Department: Payer: BC Managed Care – PPO

## 2021-06-15 DIAGNOSIS — M79671 Pain in right foot: Secondary | ICD-10-CM | POA: Insufficient documentation

## 2021-06-15 HISTORY — DX: Essential (primary) hypertension: I10

## 2021-06-15 HISTORY — DX: Disorder of thyroid, unspecified: E07.9

## 2021-06-15 MED ORDER — OXYCODONE HCL 5 MG PO TABS
5.0000 mg | ORAL_TABLET | Freq: Four times a day (QID) | ORAL | 0 refills | Status: AC | PRN
Start: 2021-06-15 — End: 2021-06-22

## 2021-06-15 NOTE — ED Triage Notes (Signed)
Pt. BIB husband c/o R foot pain since Thursday.  Pt. States pain is under the ball of her foot.  Pt. Denies trauma, injury.  Pt. Has good PMS distal to site of pain.  Pt. Is A+Ox4, moving all extremities with no other medical complaints.    BP 166/88   Pulse 95   Temp 97.1 F (36.2 C)   Resp 14   Ht 5\' 5"  (1.651 m)   Wt 81.2 kg   SpO2 96%   BMI 29.79 kg/m

## 2021-06-15 NOTE — ED Notes (Signed)
Bed: GR5  Expected date:   Expected time:   Means of arrival:   Comments:

## 2021-06-15 NOTE — ED Notes (Signed)
Bed: GR6  Expected date:   Expected time:   Means of arrival:   Comments:

## 2021-06-15 NOTE — ED Provider Notes (Signed)
EMERGENCY DEPARTMENT HISTORY AND PHYSICAL EXAM      Date: 06/15/2021  Patient Name: Katelyn Benjamin    History of Presenting Illness     Chief Complaint   Patient presents with    Foot Pain       History Provided By: Patient    Additional History: Katelyn Benjamin is a 65 y.o. female presenting to the ED with right foot pain.  Over the last 3 days he has had a constant throbbing pain on the dorsum of her right foot just inferior to her second and third digits.  She has had some redness over this area.  She denies any accidents, trauma, falls.  She denies any diminished range of motion in her ankle or toes.      PCP: No primary care provider on file.  SPECIALISTS:    No current facility-administered medications for this encounter.     Current Outpatient Medications   Medication Sig Dispense Refill    hydroCHLOROthiazide (MICROZIDE) 12.5 MG capsule Take 12.5 mg by mouth      Phentermine-Topiramate (Qsymia) 3.75-23 MG Capsule SR 24 hr Take by mouth      levothyroxine (SYNTHROID) 25 MCG tablet PLEASE SEE ATTACHED FOR DETAILED DIRECTIONS      lisinopril (ZESTRIL) 20 MG tablet Take 20 mg by mouth daily         Past History     Past Medical History:  Past Medical History:   Diagnosis Date    Disorder of thyroid     Hypertension        Past Surgical History:  Past Surgical History:   Procedure Laterality Date    ORTHOPEDIC SURGERY         Family History:  History reviewed. No pertinent family history.    Social History:  Social History     Tobacco Use    Smoking status: Never    Smokeless tobacco: Never   Vaping Use    Vaping Use: Never used   Substance Use Topics    Alcohol use: Yes    Drug use: Never       Allergies:  No Known Allergies    Review of Systems     Review of Systems   Constitutional:  Negative for chills, fatigue and fever.   HENT:  Negative for congestion, rhinorrhea and sore throat.    Respiratory:  Negative for cough, chest tightness and shortness of breath.    Cardiovascular:  Negative for chest pain  and palpitations.   Gastrointestinal:  Negative for abdominal pain, nausea and vomiting.   Musculoskeletal:  Negative for back pain, joint swelling, myalgias and neck pain.        Foot pain   Skin:  Negative for color change, pallor and rash.   Neurological:  Negative for dizziness, weakness, numbness and headaches.     Physical Exam   BP 166/88   Pulse 95   Temp 97.1 F (36.2 C)   Resp 14   Ht 5\' 5"  (1.651 m)   Wt 81.2 kg   SpO2 96%   BMI 29.79 kg/m     Physical Exam  Vitals reviewed.   Constitutional:       General: She is not in acute distress.     Appearance: She is well-developed. She is not ill-appearing, toxic-appearing or diaphoretic.   HENT:      Head: Normocephalic and atraumatic.      Mouth/Throat:      Mouth: No oral lesions.  Tonsils: No tonsillar exudate or tonsillar abscesses.   Eyes:      Extraocular Movements: Extraocular movements intact.      Conjunctiva/sclera: Conjunctivae normal.   Cardiovascular:      Rate and Rhythm: Normal rate and regular rhythm.   Pulmonary:      Effort: Pulmonary effort is normal. No respiratory distress.      Breath sounds: No stridor.   Abdominal:      General: There is no distension.      Palpations: Abdomen is soft.      Tenderness: There is no abdominal tenderness.   Musculoskeletal:      Cervical back: Normal range of motion and neck supple.      Comments: Right foot: Circular swelling noted on the dorsum of the foot just inferior to the second and third digits.  Mild tenderness palpation over this area.  Range of motion intact in all toes and ankle.  +2 dorsalis pedis pulse.  Sensation intact diffusely.   Skin:     General: Skin is warm and dry.      Capillary Refill: Capillary refill takes less than 2 seconds.   Neurological:      General: No focal deficit present.      Mental Status: She is alert and oriented to person, place, and time.   Psychiatric:         Mood and Affect: Mood normal.         Behavior: Behavior normal.       Diagnostic Study  Results     Labs -     Results       ** No results found for the last 24 hours. **            Radiologic Studies -   Radiology Results (24 Hour)       Procedure Component Value Units Date/Time    Foot Right AP Lateral And Oblique [[id number]] Collected: 06/15/21 1115    Order Status: Completed Updated: 06/15/21 1118    Narrative:      HISTORY: Acute right foot pain, tenderness, trauma    EXAM: Right foot, 3 views    FINDINGS: Well-defined soft tissue swelling is seen in the plantar  aspect of the forefoot over the MTP joints.    There is mild diffuse osteopenia with no acute fracture or dislocation.    Mild degenerative changes are seen off the dorsal talus. No destructive  process or foreign body is seen.      Impression:       Diffuse osteopenia. Mild degenerative changes.    Charlene Brooke, MD   06/15/2021 11:16 AM        .    Medical Decision Making   I am the first provider for this patient.    I reviewed the vital signs, available nursing notes, past medical history, past surgical history, family history and social history.    Vital Signs-Reviewed the patient's vital signs.   Patient Vitals for the past 12 hrs:   BP Temp Pulse Resp   06/15/21 1031 166/88 -- 95 14   06/15/21 1030 -- 97.1 F (36.2 C) -- --       Pulse Oximetry Analysis - Normal 96% on RA          Old Medical Records: Old medical records.  Nursing notes.       Provider Notes: 65 year old female presents emergency department for right foot pain.  X-rays of the right foot are unremarkable for  any acute fracture.  Higher suspicion for foot sprain versus tendinitis.  She has a history of chronic kidney disease.  I recommended that she avoid NSAIDs for pain control.  She was given cast shoe.  I recommended use of Tylenol and ice as needed for pain control.  I provided her podiatry outpatient follow-up information.  We discussed return precautions.  We will discharge home.    Diagnosis     Clinical Impression:   1. Right foot pain        Treatment  Plan:   ED Disposition       ED Disposition   Discharge    Condition   --    Date/Time   Sat Jun 15, 2021 11:31 AM    Comment   Katelyn Media Pizzini discharge to home/self care.    Condition at disposition: Stable                   _______________________________      Attestations: This note is prepared by Roseanne Kaufman, MD.    _______________________________     Everlean Cherry, MD  06/15/21 [phone]

## 2021-06-17 ENCOUNTER — Other Ambulatory Visit: Payer: Self-pay | Admitting: Foot & Ankle Surgery

## 2021-06-17 DIAGNOSIS — M85879 Other specified disorders of bone density and structure, unspecified ankle and foot: Secondary | ICD-10-CM

## 2021-06-20 ENCOUNTER — Other Ambulatory Visit: Payer: Self-pay

## 2021-06-20 ENCOUNTER — Ambulatory Visit: Payer: BC Managed Care – PPO | Attending: Foot & Ankle Surgery

## 2021-06-20 DIAGNOSIS — M85879 Other specified disorders of bone density and structure, unspecified ankle and foot: Secondary | ICD-10-CM | POA: Insufficient documentation
